# Patient Record
Sex: Female | Born: 1985 | Race: White | Hispanic: No | Marital: Married | Smoking: Never smoker
Health system: Southern US, Community
[De-identification: ages and names within clinical notes are randomized; demographics above are authoritative.]

## PROBLEM LIST (undated history)

## (undated) ENCOUNTER — Inpatient Hospital Stay: Admission: RE | Payer: 59 | Source: Ambulatory Visit

## (undated) DIAGNOSIS — F419 Anxiety disorder, unspecified: Secondary | ICD-10-CM

## (undated) DIAGNOSIS — J45909 Unspecified asthma, uncomplicated: Secondary | ICD-10-CM

## (undated) DIAGNOSIS — F329 Major depressive disorder, single episode, unspecified: Secondary | ICD-10-CM

## (undated) DIAGNOSIS — K219 Gastro-esophageal reflux disease without esophagitis: Secondary | ICD-10-CM

## (undated) DIAGNOSIS — R51 Headache: Secondary | ICD-10-CM

## (undated) DIAGNOSIS — R519 Headache, unspecified: Secondary | ICD-10-CM

## (undated) DIAGNOSIS — F32A Depression, unspecified: Secondary | ICD-10-CM

## (undated) HISTORY — PX: KNEE SURGERY: SHX244

## (undated) HISTORY — DX: Major depressive disorder, single episode, unspecified: F32.9

## (undated) HISTORY — PX: OTHER SURGICAL HISTORY: SHX169

## (undated) HISTORY — DX: Depression, unspecified: F32.A

## (undated) HISTORY — DX: Anxiety disorder, unspecified: F41.9

---

## 2009-10-03 ENCOUNTER — Emergency Department: Payer: Self-pay | Admitting: Internal Medicine

## 2012-07-17 ENCOUNTER — Emergency Department: Payer: Self-pay | Admitting: Emergency Medicine

## 2013-02-16 ENCOUNTER — Inpatient Hospital Stay: Payer: Self-pay | Admitting: Obstetrics and Gynecology

## 2013-02-17 LAB — CBC WITH DIFFERENTIAL/PLATELET
Basophil #: 0 10*3/uL (ref 0.0–0.1)
Basophil %: 0.3 %
Eosinophil #: 0.1 10*3/uL (ref 0.0–0.7)
HCT: 33.7 % — ABNORMAL LOW (ref 35.0–47.0)
HGB: 11.2 g/dL — ABNORMAL LOW (ref 12.0–16.0)
Lymphocyte #: 1.3 10*3/uL (ref 1.0–3.6)
Monocyte %: 6.6 %
Neutrophil %: 79.7 %
Platelet: 251 10*3/uL (ref 150–440)

## 2013-02-19 LAB — CBC WITH DIFFERENTIAL/PLATELET
Eosinophil %: 0 %
HCT: 33.2 % — ABNORMAL LOW (ref 35.0–47.0)
Lymphocyte %: 7.6 %
MCH: 27.3 pg (ref 26.0–34.0)
MCHC: 32.9 g/dL (ref 32.0–36.0)
MCV: 83 fL (ref 80–100)
Monocyte %: 4.2 %
Neutrophil #: 14.4 10*3/uL — ABNORMAL HIGH (ref 1.4–6.5)
Neutrophil %: 88.1 %
RBC: 3.99 10*6/uL (ref 3.80–5.20)
RDW: 14.5 % (ref 11.5–14.5)
WBC: 16.3 10*3/uL — ABNORMAL HIGH (ref 3.6–11.0)

## 2013-02-22 ENCOUNTER — Emergency Department: Payer: Self-pay | Admitting: Emergency Medicine

## 2013-02-22 LAB — URINALYSIS, COMPLETE
Glucose,UR: NEGATIVE mg/dL (ref 0–75)
Ketone: NEGATIVE
Nitrite: NEGATIVE
Ph: 6 (ref 4.5–8.0)
Protein: NEGATIVE
WBC UR: 57 /HPF (ref 0–5)

## 2013-02-22 LAB — COMPREHENSIVE METABOLIC PANEL
Albumin: 2.3 g/dL — ABNORMAL LOW (ref 3.4–5.0)
Alkaline Phosphatase: 115 U/L (ref 50–136)
BUN: 11 mg/dL (ref 7–18)
Bilirubin,Total: 0.2 mg/dL (ref 0.2–1.0)
Calcium, Total: 8.2 mg/dL — ABNORMAL LOW (ref 8.5–10.1)
Chloride: 109 mmol/L — ABNORMAL HIGH (ref 98–107)
Co2: 23 mmol/L (ref 21–32)
Potassium: 3.6 mmol/L (ref 3.5–5.1)
Total Protein: 6.2 g/dL — ABNORMAL LOW (ref 6.4–8.2)

## 2013-02-22 LAB — CBC
MCHC: 32.9 g/dL (ref 32.0–36.0)
RDW: 15.3 % — ABNORMAL HIGH (ref 11.5–14.5)

## 2013-02-28 LAB — CULTURE, BLOOD (SINGLE)

## 2014-10-06 LAB — OB RESULTS CONSOLE RUBELLA ANTIBODY, IGM: RUBELLA: IMMUNE

## 2014-10-06 LAB — OB RESULTS CONSOLE ABO/RH: RH Type: POSITIVE

## 2014-10-06 LAB — OB RESULTS CONSOLE GC/CHLAMYDIA
Chlamydia: NEGATIVE
GC PROBE AMP, GENITAL: NEGATIVE

## 2014-10-06 LAB — OB RESULTS CONSOLE VARICELLA ZOSTER ANTIBODY, IGG: Varicella: IMMUNE

## 2014-10-06 LAB — OB RESULTS CONSOLE ANTIBODY SCREEN: Antibody Screen: NEGATIVE

## 2014-10-06 LAB — OB RESULTS CONSOLE HEPATITIS B SURFACE ANTIGEN: Hepatitis B Surface Ag: NEGATIVE

## 2014-10-06 LAB — OB RESULTS CONSOLE HIV ANTIBODY (ROUTINE TESTING): HIV: NONREACTIVE

## 2014-10-06 LAB — OB RESULTS CONSOLE RPR: RPR: NONREACTIVE

## 2015-02-27 ENCOUNTER — Encounter
Admit: 2015-02-27 | Disposition: A | Discharge status: Home / Self Care | Payer: Self-pay | Attending: Maternal & Fetal Medicine | Admitting: Maternal & Fetal Medicine

## 2015-03-10 NOTE — Op Note (Signed)
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  PROCEDURE: Primary low transverse C-section.  SURGEON:  Adria Devonarrie Sterling Mondo, MD   ESTIMATED BLOOD LOSS:  Approximately 500 mL.   FINDINGS: Small placenta with very skinny cord with baby weighing 6 pounds, 3 ounces with Apgars of 9 and 9.  Normal female anatomy.   DESCRIPTION OF PROCEDURE:  The patient was taken to the operating room and placed in the spine position. After adequate epidural spinal anesthesia was instilled, the patient was prepped and draped in the usual sterile fashion. A Pfannenstiel skin incision was made approximately 2 fingerbreadths above the pubic symphysis and carried sharply down to the fascia. The fascia was nicked in the midline and the incision was extended in a superolateral manner with curved Mayo scissors. The fascia was grasped with Kochers and sharply and bluntly dissected off the rectus muscles. The midline was identified and separated. The peritoneum was grasped and entered. Bladder blade was placed. A bladder flap was created. Uterine incision was made and the infant was delivered.  Thick meconium was identified and the infant was delivered with 1 nuchal cord. The nuchal cord was reduced. The cord was clamped and cut and the infant was handed to the awaiting pediatrician. Cord blood was obtained. Pitocin was started. Placenta was delivered spontaneously. The uterus was exteriorized and wrapped in a moist laparotomy sponge. Interior of the uterus was curetted with a moist lap sponge. Uterine incision was grasped with (Dictation Anomaly) pennington clamps. A running locked chromic suture and then a running imbricating chromic suture was used to close the uterine incisions. 3-0 chromic was then used to tack the bladder  back up to the uterine incision. The belly was cleared of clots and irrigated with normal saline. The uterus was placed back into the abdomen. Clots were removed. Muscle bellies were approximated with a running Vicryl suture. The On-Q trocars were then placed and the catheters were then threaded and laid down on the muscle bellies. The fascia was closed with a running 0 Vicryl suture. Plain gut suture was used to approximate the subcutaneous back.  Skin clips were placed across the incision. Dermabond was used to cover the opening where the trocar had gone through the skin.  The (Dictation Anomaly) trocar site was closed with a 4 x 4 and Tegaderm to the skin.  Telfa and 4 x 4's were placed on the Pfannenstiel skin incisions. Tape was placed. Clear urine was noted in the Foley bag. The uterus was found to be firm.  All clots were expelled.  The patient was then taken to recovery after having tolerated the procedure well. ____________________________ Elliot Gurneyarrie C. Aila Terra, MD cck:sb D: 02/25/2013 08:02:00 ET T: 02/25/2013 08:40:09 ET JOB#: [id number]  cc: Elliot Gurneyarrie C. Inita Uram, MD, <Dictator> Elliot GurneyARRIE C Aleksi Brummet MD ELECTRONICALLY SIGNED 02/26/2013 9:28

## 2015-03-13 ENCOUNTER — Other Ambulatory Visit: Payer: Self-pay | Admitting: Maternal & Fetal Medicine

## 2015-03-19 NOTE — Consult Note (Signed)
Referral Information:  Reason for Referral 29 yo gravida 3 para 1011 at [redacted]w[redacted]d gestation by dates and [redacted]w[redacted]d [place] is referred for evaluation of fetal renal pelviectasis.   Referring Physician [facility]   Prenatal Hx Normal first trimeter screening.  Female fetus  12/04/2014 - [redacted]w[redacted]d - R renal pelvis measured 4 mm; L renal pelvis measured 4.7 mm.   01/19/2015 - [redacted]w[redacted]d - R renal pelvis measured 14.5 mm; L renal pelvis measured 7 mm.   Past Obstetrical Hx 03/01/2013 - Cesarean Section at [redacted] weeks gestation for 6'3oz female infant.  NR FHTs   Home Medications: Medication Instructions Status  albuterol  inhaled , As Needed - for Wheezing Active  Zantac 150 150 mg oral tablet 1 tab(s) orally 2 times a day Active  multivitamin, prenatal 1   once a day Active   Allergies:   Penicillin: Hives  Amoxicillin: Unknown  Prednisone: Unknown  Vital Signs/Notes:  Nursing Vital Signs: **Vital Signs.:   11-Apr-16 08:52  Vital Signs Type Routine  Temperature Temperature (F) 98.1  Celsius 36.7  Pulse Pulse 104  Respirations Respirations 18  Systolic BP Systolic BP 125  Diastolic BP (mmHg) Diastolic BP (mmHg) 56  Mean BP 79  Pulse Ox % Pulse Ox % 98  Pulse Ox Activity Level  At rest  Oxygen Delivery Room Air/ 21 %   Perinatal Consult:  LMP 10-Jul-2014   PMed Hx Exercise induced asthma - has not used inhaler in 3 years   PSurg Hx cesarean delivery   FHx Father - HTN, DM; Mother - HTN, thyroid disease;   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  The left renal pelvis measures 6.1 mm, which is within normal limits (< 7 mm) for this gestational age.  The right renal pelvis measures between 7.3 and 7.7 mm.  There is no flattening of the pyramids.   Impression/Recommendations:  Impression 29 yo gravida 3 para 1011 at [redacted]w[redacted]d gestation by dates and [redacted]w[redacted]d [place] with: 1. fetal renal pelviectasis 2. obesity 3. previous cesarean with 6'3oz fetus   Recommendations 1. fetal renal pelviectasis     The patient and her husband were counseled about fetal pelviectasis.  Pelviectasis can improve, worsen, or remain stable over time. Possible etiologies include normal variant, ureteropelvic junction obstruction, ureterovesical junction obstruction or reflux, and bladder outlet obstruction.      The patient was scheduled to return in 4 weeks to reassess the fetal kidneys and assess fetal growth.  If the pelviectasis persists, which I anticipate, the newborn will need postnatal imaging and follow-up.  The pediatrician should be apprised. 2. obesity - follow-up ultrasound scheduled 3. previous cesarean with 6'3oz fetus     The current plan is for scheduled cesarean delivery 04/14/2015.  If the patient labors spontaneously, she will consider a trial of labor.    Total Time Spent with Patient 30 minutes   >50% of  visit spent in couseling/coordination of care yes   Office Use Only [address]  Level 2 (30min) NEW office consult exp prob focused   Coding Description: FETAL - 2nd/3rd TRIMESTER INDICATION(S).   ANATOMY - Detailed - Also check indication/why detailed.   Hydronephrosis - Pylectasis.  Electronic Signatures: Angela Morton, Angela Morton (MD)  (Signed 11-Apr-16 10:49)  Authored: Referral, Home Medications, Allergies, Vital Signs/Notes, Consult, Exam, Lab/Radiology Notes, Impression, Billing, Coding Description   Last Updated: 11-Apr-16 10:49 by Angela Morton,  Angela Morton (MD)

## 2015-03-21 ENCOUNTER — Other Ambulatory Visit: Payer: Self-pay | Admitting: Maternal & Fetal Medicine

## 2015-03-21 DIAGNOSIS — O358XX1 Maternal care for other (suspected) fetal abnormality and damage, fetus 1: Principal | ICD-10-CM

## 2015-03-21 DIAGNOSIS — IMO0001 Reserved for inherently not codable concepts without codable children: Secondary | ICD-10-CM

## 2015-03-30 ENCOUNTER — Ambulatory Visit
Admission: RE | Admit: 2015-03-30 | Discharge: 2015-03-30 | Disposition: A | Discharge status: Home / Self Care | Payer: 59 | Source: Ambulatory Visit | Attending: Maternal & Fetal Medicine | Admitting: Maternal & Fetal Medicine

## 2015-03-30 DIAGNOSIS — Z3A37 37 weeks gestation of pregnancy: Secondary | ICD-10-CM | POA: Diagnosis not present

## 2015-03-30 DIAGNOSIS — O358XX1 Maternal care for other (suspected) fetal abnormality and damage, fetus 1: Secondary | ICD-10-CM | POA: Diagnosis present

## 2015-03-30 DIAGNOSIS — IMO0001 Reserved for inherently not codable concepts without codable children: Secondary | ICD-10-CM

## 2015-03-30 LAB — US OB FOLLOW UP

## 2015-04-13 ENCOUNTER — Encounter
Admission: RE | Admit: 2015-04-13 | Discharge: 2015-04-13 | Disposition: A | Discharge status: Home / Self Care | Payer: 59 | Source: Ambulatory Visit | Attending: Obstetrics & Gynecology | Admitting: Obstetrics & Gynecology

## 2015-04-13 HISTORY — DX: Headache, unspecified: R51.9

## 2015-04-13 HISTORY — DX: Gastro-esophageal reflux disease without esophagitis: K21.9

## 2015-04-13 HISTORY — DX: Unspecified asthma, uncomplicated: J45.909

## 2015-04-13 HISTORY — DX: Headache: R51

## 2015-04-13 LAB — CBC
HCT: 33 % — ABNORMAL LOW (ref 35.0–47.0)
Hemoglobin: 10.6 g/dL — ABNORMAL LOW (ref 12.0–16.0)
MCH: 26.5 pg (ref 26.0–34.0)
MCHC: 32.2 g/dL (ref 32.0–36.0)
MCV: 82.2 fL (ref 80.0–100.0)
Platelets: 225 10*3/uL (ref 150–440)
RBC: 4.02 MIL/uL (ref 3.80–5.20)
RDW: 14.9 % — ABNORMAL HIGH (ref 11.5–14.5)
WBC: 11.3 10*3/uL — AB (ref 3.6–11.0)

## 2015-04-13 NOTE — Discharge Instructions (Signed)
Angela Morton   Your procedure is scheduled on: Apr 14, 2015   Report to [facility] at 11:30             [facility]             [address]             [place], [place] [address]  Call this number if you have problems the morning of surgery: [phone]   Remember:   Do not eat food or drink liquids after midnight.     Do not wear jewelry, make-up or nail polish.  Do not wear lotions, powders, or perfumes. You may wear deodorant.  Do not shave prior to surgery.  Do not bring valuables to the hospital.  [facility] is not responsible for any belongings              or valuables.                Contacts, dentures or bridgework may not be worn into surgery.  Leave suitcase in the car. After surgery it may be brought to your room.  For patients admitted to the hospital, discharge time is determined by your             treatment team.               Special Instructions: Preparing the skin before Cesarean Section              To help prevent the risk of infection at your surgical site, we are providing             you with rinse-free Sage 2% Chlorhexidine Gluconate (HCG) disposable             wipes.               The night before surgery:              1. Shower or bathe with warm water             2. Do not apply lotion or perfume             3. Wait one hour after shower, skin should be dry and cool             4. Open Sage wipe package - 2 disposable cloths are inside             5. Wipe the lower abdomen from the pubic line to the navel and hip bone to hip             bone with one cloth             6. Use the second cloth to wipe the front of the upper thighs             7. Allow the area to dry for one minute. DO NOT RINSE             8. Skin may feel "tacky" for several minutes             9. Dress in freshly laundered, clean clothes           10. Do not shower the morning of surgery    Please read over the following fact sheets  that you were given: Coughing and  Deep Breathing and Surgical Site Infection Prevention   Cesarean Delivery  Cesarean delivery is the birth of a baby through a cut (incision) in the abdomen and womb (uterus).  LET [facility] CARE PROVIDER KNOW ABOUT:  All medicines you are taking, including vitamins, herbs, eye drops, creams, and over-the-counter medicines.  Previous problems you or members of your family have had with the use of anesthetics.  Any blood disorders you have.  Previous surgeries you have had.  Medical conditions you have.  Any allergies you have.  Complicationsinvolving the pregnancy. RISKS AND COMPLICATIONS  Generally, this is a safe procedure. However, as with any procedure, complications can occur. Possible complications include:  Bleeding.  Infection.  Blood clots.  Injury to surrounding organs.  Problems with anesthesia.  Injury to the baby. BEFORE THE PROCEDURE   You may be given an antacid medicine to drink. This will prevent acid contents in your stomach from going into your lungs if you vomit during the surgery.  You may be given an antibiotic medicine to prevent infection. PROCEDURE   Hair may be removed from your pubic area and your lower abdomen. This is to prevent infection in the incision site.  A tube (Foley catheter) will be placed in your bladder to drain your urine from your bladder into a bag. This keeps your bladder empty during surgery.  An IV tube will be placed in your vein.  You may be given medicine to numb the lower half of your body (regional anesthetic). If you were in labor, you may have already had an epidural in place which can be used in both labor and cesarean delivery. You may possibly be given medicine to make you sleep (general anesthetic) though this is not as common.  An incision will be made in your abdomen that extends to your uterus. There are 2 basic kinds of incisions:  The horizontal (transverse)  incision. Horizontal incisions are from side to side and are used for most routine cesarean deliveries.  The vertical incision. The vertical incision is from the top of the abdomen to the bottom and is less commonly used. It is often done for women who have a serious complication (extreme prematurity) or under emergency situations.  The horizontal and vertical incisions may both be used at the same time. However, this is very uncommon.  An incision is then made in your uterus to deliver the baby.  Your baby will then be delivered.  Both incisions are then closed with absorbable stitches. AFTER THE PROCEDURE   If you were awake during the surgery, you will see your baby right away. If you were asleep, you will see your baby as soon as you are awake.  You may breastfeed your baby after surgery.  You may be able to get up and walk the same day as the surgery. If you need to stay in bed for a period of time, you will receive help to turn, cough, and take deep breaths after surgery. This helps prevent lung problems such as pneumonia.  Do not get out of bed alone the first time after surgery. You will need help getting out of bed until you are able to do this by yourself.  You may be able to shower the day after your cesarean delivery. After the bandage (dressing) is taken off the incision site, a nurse will assist you to shower if you would like help.  You will have pneumatic compression hose placed on your lower legs. This is  done to prevent blood clots. When you are up and walking regularly, they will no longer be necessary.  Do not cross your legs when you sit.  Save any blood clots that you pass. If you pass a clot while on the toilet, do not flush it. Call for the nurse. Tell the nurse if you think you are bleeding too much or passing too many clots.  You will be given medicine as needed. Let your health care providers know if you are hurting. You may also be given an antibiotic to  prevent an infection.  Your IV tube will be taken out when you are drinking a reasonable amount of fluids. The Foley catheter is taken out when you are up and walking.  If your blood type is Rh negative and your baby's blood type is Rh positive, you will be given a shot of anti-D immune globulin. This shot prevents you from having Rh problems with a future pregnancy. You should get the shot even if you had your tubes tied (tubal ligation).  If you are allowed to take the baby for a walk, place the baby in the bassinet and push it. Do not carry your baby in your arms. Document Released: 11/04/2005 Document Revised: 08/25/2013 Document Reviewed: 05/26/2013 [facility] Patient Information 2015 [place], [place]. This information is not intended to replace advice given to you by your health care provider. Make sure you discuss any questions you have with your health care provider.  Incentive Spirometer An incentive spirometer is a tool that can help keep your lungs clear and active. This tool measures how well you are filling your lungs with each breath. Taking long, deep breaths may help reverse or decrease the chance of developing breathing (pulmonary) problems (especially infection) following:  Surgery of the chest or abdomen.  Surgery if you have a history of smoking or a lung problem.  A long period of time when you are unable to move or be active. BEFORE THE PROCEDURE   If the spirometer includes an indicator to show your best effort, your nurse or respiratory therapist will set it to a desired goal.  If possible, sit up straight or lean slightly forward. Try not to slouch.  Hold the incentive spirometer in an upright position. INSTRUCTIONS FOR USE   Sit on the edge of your bed if possible, or sit up as far as you can in bed or on a chair.  Hold the incentive spirometer in an upright position.  Breathe out normally.  Place the mouthpiece in your mouth and seal your lips tightly around  it.  Breathe in slowly and as deeply as possible, raising the piston or the ball toward the top of the column.  Hold your breath for 3-5 seconds or for as long as possible. Allow the piston or ball to fall to the bottom of the column.  Remove the mouthpiece from your mouth and breathe out normally.  Rest for a few seconds and repeat Steps 1 through 7 at least 10 times every 1-2 hours when you are awake. Take your time and take a few normal breaths between deep breaths.  The spirometer may include an indicator to show your best effort. Use the indicator as a goal to work toward during each repetition.  After each set of 10 deep breaths, practice coughing to be sure your lungs are clear. If you have an incision (the cut made at the time of surgery), support your incision when coughing by placing a pillow  or rolled-up towels firmly against it. Once you are able to get out of bed, walk around indoors and cough well. You may stop using the incentive spirometer when instructed by your caregiver.  RISKS AND COMPLICATIONS  Breathing too quickly may cause dizziness. At an extreme, this could cause you to pass out. Take your time so you do not get dizzy or light-headed.  If you are in pain, you may need to take or ask for pain medication before doing incentive spirometry. It is harder to take a deep breath if you are having pain. AFTER USE  Rest and breathe slowly and easily.  It can be helpful to keep a log of your progress. Your caregiver can provide you with a simple table to help with this. If you are using the spirometer at home, follow these instructions: SEEK MEDICAL CARE IF:   You are having difficultly using the spirometer.  You have trouble using the spirometer as often as instructed.  Your pain medication is not giving enough relief while using the spirometer.  You develop fever of 100.87F (38.1C) or higher. SEEK IMMEDIATE MEDICAL CARE IF:   You cough up bloody sputum that had  not been present before.  You develop fever of 102F (38.9C) or greater.  You develop worsening pain at or near the incision site. MAKE SURE YOU:   Understand these instructions.  Will watch your condition.  Will get help right away if you are not doing well or get worse. Document Released: 03/17/2007 Document Revised: 03/21/2014 Document Reviewed: 05/18/2007 [facility] Patient Information 2015 [place], [place]. This information is not intended to replace advice given to you by your health care provider. Make sure you discuss any questions you have with your health care provider.  Surgical Site Infections FAQs What is a Surgical Site Infection (SSI)? A surgical site infection is an infection that occurs after surgery in the part of the body where the surgery took place. Most patients who have surgery do not develop an infection. However, infections develop in about 1 to 3 out of every 100 patients who have surgery. Some of the common symptoms of a surgical site infection are:  Redness and pain around the area where you had surgery  Drainage of cloudy fluid from your surgical wound  Fever Can SSIs be treated? Yes. Most surgical site infections can be treated with antibiotics. The antibiotic given to you depends on the bacteria (germs) causing the infections. Sometimes patients with SSIs also need another surgery to treat the infection. What are some of the things that hospitals are doing to prevent SSIs? To prevent SSIs, doctors, nurses, and other healthcare providers:  Clean their hands and arms up to their elbows with an antiseptic agent just before the surgery.  Clean their hands with soap and water or an alcohol-based hand rub before and after caring for each patient.  May remove some of your hair immediately before your surgery using electric clippers if the hair is in the same area where the procedure will occur. They should not shave you with a razor.  Wear special hair  covers, masks, gowns, and gloves during surgery to keep the surgery area clean.  Give you antibiotics before your surgery starts. In most cases, you should get antibiotics within 60 minutes before the surgery starts and the antibiotics should be stopped within 24 hours after surgery.  Clean the skin at the site of your surgery with a special soap that kills germs. What can I do to  help prevent SSIs? Before your surgery:  Tell your doctor about other medical problems you may have. Health problems such as allergies, diabetes, and obesity could affect your surgery and your treatment.  Quit smoking. Patients who smoke get more infections. Talk to your doctor about how you can quit before your surgery.  Do not shave near where you will have surgery. Shaving with a razor can irritate your skin and make it easier to develop an infection. At the time of your surgery:  Speak up if someone tries to shave you with a razor before surgery. Ask why you need to be shaved and talk with your surgeon if you have any concerns.  Ask if you will get antibiotics before surgery. After your surgery:  Make sure that your healthcare providers clean their hands before examining you, either with soap and water or an alcohol-based hand rub.  If you do not see your providers clean their hands, please ask them to do so.  Family and friends who visit you should not touch the surgical wound or dressings.  Family and friends should clean their hands with soap and water or an alcohol-based hand rub before and after visiting you. If you do not see them clean their hands, ask them to clean their hands. What do I need to do when I go home from the hospital?  Before you go home, your doctor or nurse should explain everything you need to know about taking care of your wound. Make sure you understand how to care for your wound before you leave the hospital.  Always clean your hands before and after caring for your  wound.  Before you go home, make sure you know who to contact if you have questions or problems after you get home.  If you have any symptoms of an infection, such as redness and pain at the surgery site, drainage, or fever, call your doctor immediately. If you have additional questions, please ask your doctor or nurse. Developed and co-sponsored by [employer] for [employer] of [place] [phone]); Infectious Diseases Society of America (IDSA); [facility] Association; Association for Professionals in Infection Control and Epidemiology (APIC); Centers for Disease Control and Prevention (CDC); and [employer] Document Released: 11/09/2013 Document Reviewed: 11/09/2013 [facility] Patient Information 2015 [place], [place]. This information is not intended to replace advice given to you by your health care provider. Make sure you discuss any questions you have with your health care provider.

## 2015-04-14 ENCOUNTER — Encounter
Admission: RE | Disposition: A | Discharge status: Home / Self Care | Payer: Self-pay | Source: Home / Self Care | Attending: Obstetrics & Gynecology

## 2015-04-14 ENCOUNTER — Inpatient Hospital Stay: Payer: 59 | Admitting: Registered Nurse

## 2015-04-14 ENCOUNTER — Encounter: Payer: Self-pay | Admitting: *Deleted

## 2015-04-14 ENCOUNTER — Inpatient Hospital Stay
Admission: RE | Admit: 2015-04-14 | Discharge: 2015-04-17 | DRG: 766 | Disposition: A | Discharge status: Home / Self Care | Payer: 59 | Attending: Obstetrics & Gynecology | Admitting: Obstetrics & Gynecology

## 2015-04-14 DIAGNOSIS — O3421 Maternal care for scar from previous cesarean delivery: Principal | ICD-10-CM | POA: Diagnosis present

## 2015-04-14 DIAGNOSIS — Z3A39 39 weeks gestation of pregnancy: Secondary | ICD-10-CM | POA: Diagnosis present

## 2015-04-14 LAB — HIV ANTIBODY (ROUTINE TESTING W REFLEX): HIV Screen 4th Generation wRfx: NONREACTIVE

## 2015-04-14 LAB — RPR: RPR Ser Ql: NONREACTIVE

## 2015-04-14 SURGERY — Surgical Case
Anesthesia: Spinal | Wound class: Clean Contaminated

## 2015-04-14 MED ORDER — NALBUPHINE HCL 10 MG/ML IJ SOLN: 5.0000 mg | Freq: Once | INTRAMUSCULAR | Status: AC | PRN

## 2015-04-14 MED ORDER — CLINDAMYCIN PHOSPHATE 900 MG/50ML IV SOLN: 900.0000 mg | Freq: Once | INTRAVENOUS | Status: DC

## 2015-04-14 MED ORDER — KETOROLAC TROMETHAMINE 30 MG/ML IJ SOLN: 30.0000 mg | Freq: Four times a day (QID) | INTRAMUSCULAR | Status: AC | PRN

## 2015-04-14 MED ORDER — DIPHENHYDRAMINE HCL 50 MG/ML IJ SOLN
INTRAMUSCULAR | Status: AC
  Administered 2015-04-14: 12.5 mg via INTRAVENOUS
  Filled 2015-04-14: qty 1

## 2015-04-14 MED ORDER — CITRIC ACID-SODIUM CITRATE 334-500 MG/5ML PO SOLN
30.0000 mL | Freq: Once | ORAL | Status: AC
  Administered 2015-04-14: 30 mL via ORAL

## 2015-04-14 MED ORDER — CITRIC ACID-SODIUM CITRATE 334-500 MG/5ML PO SOLN
ORAL | Status: AC
  Administered 2015-04-14: 30 mL via ORAL
  Filled 2015-04-14: qty 15

## 2015-04-14 MED ORDER — OXYTOCIN 40 UNITS IN LACTATED RINGERS INFUSION - SIMPLE MED
62.5000 mL/h | INTRAVENOUS | Status: DC
  Administered 2015-04-14: 40 [IU] via INTRAVENOUS
  Administered 2015-04-14: 790 mL via INTRAVENOUS
  Administered 2015-04-14: 10 mL via INTRAVENOUS

## 2015-04-14 MED ORDER — NALOXONE HCL 0.4 MG/ML IJ SOLN: 0.4000 mg | INTRAMUSCULAR | Status: DC | PRN

## 2015-04-14 MED ORDER — PRENATAL MULTIVITAMIN CH
1.0000 | ORAL_TABLET | Freq: Every day | ORAL | Status: DC
  Administered 2015-04-15 – 2015-04-17 (×3): 1 via ORAL
  Filled 2015-04-14 (×3): qty 1

## 2015-04-14 MED ORDER — BUPIVACAINE 0.25 % ON-Q PUMP SINGLE CATH 400 ML: 400.0000 mL | INJECTION | Status: DC

## 2015-04-14 MED ORDER — SODIUM CHLORIDE 0.9 % IJ SOLN: 3.0000 mL | INTRAMUSCULAR | Status: DC | PRN

## 2015-04-14 MED ORDER — OXYTOCIN 40 UNITS IN LACTATED RINGERS INFUSION - SIMPLE MED
INTRAVENOUS | Status: AC
  Filled 2015-04-14: qty 1000

## 2015-04-14 MED ORDER — ACETAMINOPHEN 325 MG PO TABS: 650.0000 mg | ORAL_TABLET | ORAL | Status: DC | PRN

## 2015-04-14 MED ORDER — KETOROLAC TROMETHAMINE 30 MG/ML IJ SOLN: 30.0000 mg | Freq: Four times a day (QID) | INTRAMUSCULAR | Status: DC | PRN

## 2015-04-14 MED ORDER — WITCH HAZEL-GLYCERIN EX PADS: 1.0000 "application " | MEDICATED_PAD | CUTANEOUS | Status: DC | PRN

## 2015-04-14 MED ORDER — PHENYLEPHRINE HCL 10 MG/ML IJ SOLN
INTRAMUSCULAR | Status: DC | PRN
  Administered 2015-04-14: 50 ug via INTRAVENOUS

## 2015-04-14 MED ORDER — EPHEDRINE SULFATE 50 MG/ML IJ SOLN
INTRAMUSCULAR | Status: DC | PRN
  Administered 2015-04-14: 10 mg via INTRAVENOUS

## 2015-04-14 MED ORDER — TETANUS-DIPHTH-ACELL PERTUSSIS 5-2.5-18.5 LF-MCG/0.5 IM SUSP: 0.5000 mL | Freq: Once | INTRAMUSCULAR | Status: DC

## 2015-04-14 MED ORDER — DIPHENHYDRAMINE HCL 50 MG/ML IJ SOLN: 12.5000 mg | INTRAMUSCULAR | Status: DC | PRN

## 2015-04-14 MED ORDER — BUPIVACAINE IN DEXTROSE 0.75-8.25 % IT SOLN
INTRATHECAL | Status: DC | PRN
  Administered 2015-04-14: 1.7 mL via INTRATHECAL

## 2015-04-14 MED ORDER — OXYTOCIN BOLUS FROM INFUSION: 500.0000 mL | INTRAVENOUS | Status: DC

## 2015-04-14 MED ORDER — BUPIVACAINE HCL (PF) 0.5 % IJ SOLN
INTRAMUSCULAR | Status: AC
  Filled 2015-04-14: qty 30

## 2015-04-14 MED ORDER — OXYCODONE-ACETAMINOPHEN 5-325 MG PO TABS
1.0000 | ORAL_TABLET | ORAL | Status: DC | PRN
  Administered 2015-04-16 – 2015-04-17 (×6): 1 via ORAL
  Filled 2015-04-14 (×6): qty 1

## 2015-04-14 MED ORDER — NALBUPHINE HCL 10 MG/ML IJ SOLN: 5.0000 mg | INTRAMUSCULAR | Status: DC | PRN

## 2015-04-14 MED ORDER — DIPHENHYDRAMINE HCL 25 MG PO CAPS: 25.0000 mg | ORAL_CAPSULE | ORAL | Status: DC | PRN

## 2015-04-14 MED ORDER — MEPERIDINE HCL 25 MG/ML IJ SOLN: 6.2500 mg | INTRAMUSCULAR | Status: DC | PRN

## 2015-04-14 MED ORDER — DIBUCAINE 1 % RE OINT: 1.0000 "application " | TOPICAL_OINTMENT | RECTAL | Status: DC | PRN

## 2015-04-14 MED ORDER — SIMETHICONE 80 MG PO CHEW
80.0000 mg | CHEWABLE_TABLET | Freq: Three times a day (TID) | ORAL | Status: DC
  Administered 2015-04-15 (×2): 80 mg via ORAL
  Filled 2015-04-14: qty 1

## 2015-04-14 MED ORDER — NALOXONE HCL 1 MG/ML IJ SOLN
1.0000 ug/kg/h | INTRAVENOUS | Status: DC | PRN
  Filled 2015-04-14: qty 2

## 2015-04-14 MED ORDER — ONDANSETRON HCL 4 MG/2ML IJ SOLN: 4.0000 mg | Freq: Three times a day (TID) | INTRAMUSCULAR | Status: DC | PRN

## 2015-04-14 MED ORDER — OXYTOCIN 40 UNITS IN LACTATED RINGERS INFUSION - SIMPLE MED
INTRAVENOUS | Status: AC
  Administered 2015-04-14: 40 [IU] via INTRAVENOUS
  Filled 2015-04-14: qty 1000

## 2015-04-14 MED ORDER — ONDANSETRON HCL 4 MG/2ML IJ SOLN
INTRAMUSCULAR | Status: DC | PRN
  Administered 2015-04-14: 4 mg via INTRAVENOUS

## 2015-04-14 MED ORDER — MENTHOL 3 MG MT LOZG
1.0000 | LOZENGE | OROMUCOSAL | Status: DC | PRN
  Filled 2015-04-14: qty 9

## 2015-04-14 MED ORDER — SIMETHICONE 80 MG PO CHEW: 80.0000 mg | CHEWABLE_TABLET | ORAL | Status: DC | PRN

## 2015-04-14 MED ORDER — CLINDAMYCIN PHOSPHATE 900 MG/50ML IV SOLN
INTRAVENOUS | Status: AC
  Filled 2015-04-14: qty 50

## 2015-04-14 MED ORDER — LANOLIN HYDROUS EX OINT: 1.0000 "application " | TOPICAL_OINTMENT | CUTANEOUS | Status: DC | PRN

## 2015-04-14 MED ORDER — SCOPOLAMINE 1 MG/3DAYS TD PT72
1.0000 | MEDICATED_PATCH | Freq: Once | TRANSDERMAL | Status: DC
  Filled 2015-04-14: qty 1

## 2015-04-14 MED ORDER — DIPHENHYDRAMINE HCL 50 MG/ML IJ SOLN
12.5000 mg | Freq: Once | INTRAMUSCULAR | Status: AC
  Administered 2015-04-14: 12.5 mg via INTRAVENOUS

## 2015-04-14 MED ORDER — DEXTROSE 5 % IV SOLN: 2.0000 mg/kg | Freq: Once | INTRAVENOUS | Status: DC

## 2015-04-14 MED ORDER — NALOXONE HCL 1 MG/ML IJ SOLN: 1.0000 ug/kg/h | INTRAVENOUS | Status: DC | PRN

## 2015-04-14 MED ORDER — BUPIVACAINE HCL (PF) 0.5 % IJ SOLN: 10.0000 mL | Freq: Once | INTRAMUSCULAR | Status: DC

## 2015-04-14 MED ORDER — GENTAMICIN SULFATE 40 MG/ML IJ SOLN
5.0000 mg/kg | INTRAMUSCULAR | Status: AC
  Administered 2015-04-14: 570 mg via INTRAVENOUS
  Filled 2015-04-14: qty 14.25

## 2015-04-14 MED ORDER — LACTATED RINGERS IV SOLN
INTRAVENOUS | Status: DC
  Administered 2015-04-15: 11:00:00 via INTRAVENOUS

## 2015-04-14 MED ORDER — OXYCODONE-ACETAMINOPHEN 5-325 MG PO TABS: 2.0000 | ORAL_TABLET | ORAL | Status: DC | PRN

## 2015-04-14 MED ORDER — DIPHENHYDRAMINE HCL 25 MG PO CAPS: 25.0000 mg | ORAL_CAPSULE | Freq: Four times a day (QID) | ORAL | Status: DC | PRN

## 2015-04-14 MED ORDER — SIMETHICONE 80 MG PO CHEW
80.0000 mg | CHEWABLE_TABLET | ORAL | Status: DC
  Administered 2015-04-15 (×2): 80 mg via ORAL
  Filled 2015-04-14 (×3): qty 1

## 2015-04-14 MED ORDER — IBUPROFEN 600 MG PO TABS
600.0000 mg | ORAL_TABLET | Freq: Four times a day (QID) | ORAL | Status: DC
  Administered 2015-04-15 – 2015-04-17 (×12): 600 mg via ORAL
  Filled 2015-04-14 (×11): qty 1

## 2015-04-14 MED ORDER — NALBUPHINE HCL 10 MG/ML IJ SOLN: 5.0000 mg | Freq: Once | INTRAMUSCULAR | Status: DC | PRN

## 2015-04-14 MED ORDER — LACTATED RINGERS IV SOLN
INTRAVENOUS | Status: DC | PRN
Start: 2015-04-14 — End: 2015-04-14
  Administered 2015-04-14: 14:00:00 via INTRAVENOUS

## 2015-04-14 MED ORDER — OXYTOCIN 40 UNITS IN LACTATED RINGERS INFUSION - SIMPLE MED: 62.5000 mL/h | INTRAVENOUS | Status: AC

## 2015-04-14 MED ORDER — CLINDAMYCIN PHOSPHATE 900 MG/50ML IV SOLN
900.0000 mg | INTRAVENOUS | Status: AC
  Administered 2015-04-14: 900 mg via INTRAVENOUS

## 2015-04-14 MED ORDER — BUPIVACAINE 0.25 % ON-Q PUMP DUAL CATH 400 ML: 400.0000 mL | INJECTION | Status: DC

## 2015-04-14 MED ORDER — BUPIVACAINE 0.25 % ON-Q PUMP DUAL CATH 400 ML
INJECTION | Status: AC
  Filled 2015-04-14: qty 400

## 2015-04-14 MED ORDER — BUPIVACAINE HCL (PF) 0.5 % IJ SOLN
INTRAMUSCULAR | Status: DC | PRN
  Administered 2015-04-14: 10 mL

## 2015-04-14 MED ORDER — DOCUSATE SODIUM 100 MG PO CAPS
100.0000 mg | ORAL_CAPSULE | Freq: Two times a day (BID) | ORAL | Status: DC
  Administered 2015-04-15 – 2015-04-17 (×5): 100 mg via ORAL
  Filled 2015-04-14 (×5): qty 1

## 2015-04-14 MED ORDER — MORPHINE SULFATE (PF) 0.5 MG/ML IJ SOLN
INTRAMUSCULAR | Status: DC | PRN
  Administered 2015-04-14: .2 mg via EPIDURAL

## 2015-04-14 SURGICAL SUPPLY — 37 items
CANISTER SUCT 3000ML (MISCELLANEOUS) ×3 IMPLANT
CATH KIT ON-Q SILVERSOAK 5IN (CATHETERS) ×6 IMPLANT
CELL SAVER ADDITIONAL TIME PER (MISCELLANEOUS) ×135
CELL SAVER AUTOCOAGULATE (MISCELLANEOUS) ×3
CELL SAVER STANDBY W/COLL (MISCELLANEOUS) ×3
CLOSURE WOUND 1/2 X4 (GAUZE/BANDAGES/DRESSINGS) ×1
DRSG TELFA 3X8 NADH (GAUZE/BANDAGES/DRESSINGS) ×3 IMPLANT
ELECT CAUTERY BLADE 6.4 (BLADE) ×3 IMPLANT
GAUZE SPONGE 4X4 12PLY STRL (GAUZE/BANDAGES/DRESSINGS) ×3 IMPLANT
GLOVE BIOGEL PI IND STRL 6.5 (GLOVE) ×1 IMPLANT
GLOVE BIOGEL PI INDICATOR 6.5 (GLOVE) ×2
GLOVE SURG SYN 6.5 ES PF (GLOVE) ×3 IMPLANT
GOWN STRL REUS W/ TWL LRG LVL3 (GOWN DISPOSABLE) ×3 IMPLANT
GOWN STRL REUS W/TWL LRG LVL3 (GOWN DISPOSABLE) ×6
LIQUID BAND (GAUZE/BANDAGES/DRESSINGS) ×3 IMPLANT
NS IRRIG 1000ML POUR BTL (IV SOLUTION) ×3 IMPLANT
PACK C SECTION AR (MISCELLANEOUS) ×3 IMPLANT
PAD GROUND ADULT SPLIT (MISCELLANEOUS) ×3 IMPLANT
PAD OB MATERNITY 4.3X12.25 (PERSONAL CARE ITEMS) ×3 IMPLANT
PAD PREP 24X41 OB/GYN DISP (PERSONAL CARE ITEMS) ×3 IMPLANT
SAVE CELL AUTOCOAG (MISCELLANEOUS) ×1 IMPLANT
STANDBY W/COLL CELL SAVER (MISCELLANEOUS) ×1 IMPLANT
STRAP SAFETY BODY (MISCELLANEOUS) ×3 IMPLANT
STRIP CLOSURE SKIN 1/2X4 (GAUZE/BANDAGES/DRESSINGS) ×2 IMPLANT
SUT MNCRL 4-0 (SUTURE) ×2
SUT MNCRL 4-0 27XMFL (SUTURE) ×1
SUT PDS AB 1 TP1 96 (SUTURE) ×3 IMPLANT
SUT VIC AB 0 CT1 36 (SUTURE) ×6 IMPLANT
SUT VIC AB 2-0 CT1 27 (SUTURE) ×2
SUT VIC AB 2-0 CT1 TAPERPNT 27 (SUTURE) ×1 IMPLANT
SUT VIC AB 3-0 SH 27 (SUTURE) ×2
SUT VIC AB 3-0 SH 27X BRD (SUTURE) ×1 IMPLANT
SUTURE MNCRL 4-0 27XMF (SUTURE) ×1 IMPLANT
SWABSTK COMLB BENZOIN TINCTURE (MISCELLANEOUS) ×3 IMPLANT
TIME ADDITIONAL PER CELL SAVER (MISCELLANEOUS) ×45 IMPLANT
TUBING CONNECTING 10 (TUBING) ×2 IMPLANT
TUBING CONNECTING 10' (TUBING) ×1

## 2015-04-14 NOTE — Anesthesia Procedure Notes (Signed)
Spinal Patient location during procedure: OR Staffing Anesthesiologist: Berdine AddisonHOMAS, Atreyu Mak Performed by: anesthesiologist  Preanesthetic Checklist Completed: patient identified, site marked, surgical consent, pre-op evaluation, timeout performed, IV checked and risks and benefits discussed Spinal Block Patient position: sitting Prep: Betadine Patient monitoring: heart rate, cardiac monitor, continuous pulse ox and blood pressure Approach: midline Location: L3-4 Injection technique: single-shot Needle Needle type: Pencil-Tip  Needle gauge: 25 G Needle length: 9 cm Assessment Sensory level: T8 Additional Notes Marcaine and astromorph used.

## 2015-04-14 NOTE — Anesthesia Preprocedure Evaluation (Addendum)
Anesthesia Evaluation  Patient identified by MRN, date of birth, ID band Patient awake    Reviewed: Allergy & Precautions, NPO status , Patient's Chart, lab work & pertinent test results  Airway Mallampati: III  TM Distance: >3 FB     Dental  (+) Chipped   Pulmonary asthma ,          Cardiovascular     Neuro/Psych  Headaches,    GI/Hepatic GERD-  ,  Endo/Other    Renal/GU      Musculoskeletal   Abdominal   Peds  Hematology   Anesthesia Other Findings Obesity.  Reproductive/Obstetrics                            Anesthesia Physical Anesthesia Plan  ASA: III  Anesthesia Plan: Spinal   Post-op Pain Management:    Induction:   Airway Management Planned: Nasal Cannula  Additional Equipment:   Intra-op Plan:   Post-operative Plan:   Informed Consent: I have reviewed the patients History and Physical, chart, labs and discussed the procedure including the risks, benefits and alternatives for the proposed anesthesia with the patient or authorized representative who has indicated his/her understanding and acceptance.     Plan Discussed with: CRNA  Anesthesia Plan Comments:         Anesthesia Quick Evaluation

## 2015-04-14 NOTE — Discharge Summary (Addendum)
Obstetrical Discharge Summary  Date of Admission: 04/14/2015 Date of Discharge: 04/17/2015  Primary OB: [facility]   Gestational Age at Delivery: 7639.5   Antepartum complications: BMI 30s, asthma (no meds), Jehovah's Witness Reason for Admission: Term Pregnancy, Repeat Cesarean Date of Delivery: 04/14/15 Delivered By: Ranae Plumberhelsea Ward, MD Delivery Type: repeat cesarean section, low transverse incision Intrapartum complications/course: None Anesthesia: spinal Placenta: spontaneous Baby: Liveborn female, APGARs9/9, weight 7-10.   Post partum course: uncomplicated Disposition: Home  Rh Immune globulin given: no Rubella vaccine given: no Tdap vaccine given in AP or PP setting: yes Flu vaccine given in AP or PP setting: yes  Contraception: IUD, no bridge  Prenatal Labs: O pos/RI/VI/rpr neg/hiv neg/hepB neg/tdap UTD/pap neg 2014/breast/IUD (no bridge)/[facility] follow up   Plan:  Angela LindauMegan Morton was discharged to home in good condition. Patient has h/o UTI after discharge last time so U/A sent and came back dirty so will hold off on tx since asymptomatic and follow up UCx pending. Results for Angela Morton, Angela Morton (MRN [id number]) as of 04/17/2015 14:31  Ref. Range 04/17/2015 09:43  Appearance Latest Ref Range: CLEAR  HAZY (A)  Bacteria, UA Latest Ref Range: NONE SEEN  RARE (A)  Bilirubin Urine Latest Ref Range: NEGATIVE  NEGATIVE  Color, Urine Latest Ref Range: YELLOW  STRAW (A)  Glucose Latest Ref Range: NEGATIVE mg/dL NEGATIVE  Hgb urine dipstick Latest Ref Range: NEGATIVE  3+ (A)  Ketones, ur Latest Ref Range: NEGATIVE mg/dL NEGATIVE  Leukocytes, UA Latest Ref Range: NEGATIVE  1+ (A)  Nitrite Latest Ref Range: NEGATIVE  NEGATIVE  pH Latest Ref Range: 5.0-8.0  7.0  Protein Latest Ref Range: NEGATIVE mg/dL 30 (A)  RBC / HPF Latest Ref Range: 0-5 RBC/hpf 6-30  Specific Gravity, Urine Latest Ref Range: 1.005-1.030  1.003 (L)  Squamous Epithelial / LPF Latest Ref Range: NONE SEEN  6-30 (A)  WBC,  UA Latest Ref Range: 0-5 WBC/hpf TOO NUMEROUS TO C...   [facility] Dr. Elesa MassedWard in 1 week for incision check   Discharge Medications:   Medication List    TAKE these medications        albuterol 108 (90 BASE) MCG/ACT inhaler  Commonly known as:  PROVENTIL HFA;VENTOLIN HFA  Inhale 2 puffs into the lungs every 6 (six) hours as needed for wheezing or shortness of breath.     BUPIVACAINE 0.25 % ON-Q PUMP DUAL CATH 400 ML  400 mLs by Other route continuous.     docusate sodium 100 MG capsule  Commonly known as:  COLACE  Take 1 capsule (100 mg total) by mouth 2 (two) times daily.     ferrous gluconate 324 MG tablet  Commonly known as:  FERGON  Take 1 tablet (324 mg total) by mouth 2 (two) times daily with a meal.     ibuprofen 600 MG tablet  Commonly known as:  ADVIL,MOTRIN  Take 1 tablet (600 mg total) by mouth every 6 (six) hours as needed.     oxyCODONE-acetaminophen 5-325 MG per tablet  Commonly known as:  PERCOCET/ROXICET  Take 1 tablet by mouth every 6 (six) hours as needed for severe pain.     prenatal multivitamin Tabs tablet  Take 1 tablet by mouth daily.     psyllium 28 % packet  Commonly known as:  METAMUCIL SMOOTH TEXTURE  Take 1 packet by mouth 2 (two) times daily.     ranitidine 150 MG capsule  Commonly known as:  ZANTAC  Take 150 mg  by mouth 2 (two) times daily.        Cornelia Copa MD [facility]  Pager: [phone]

## 2015-04-14 NOTE — H&P (Signed)
Jenean LindauMegan Kreiger is a 29 y.o. female [phone] @ 4239.4 presenting for repeat cesarean at term.  Patient has history of asthma, obesity, history of cesarean, and is a Jehovah's Witness and does not accept blood or products.  She has an unfavorable cervix for induction upon admission and wishes to continue as scheduled.  History  Past Medical History  Diagnosis Date  . Asthma   . GERD (gastroesophageal reflux disease)   . Headache    Past Surgical History  Procedure Laterality Date  . Knees      bil arthroscopy   Family History: family history is not on file. Social History:  reports that she has never smoked. She has never used smokeless tobacco. She reports that she does not drink alcohol or use illicit drugs.   Prenatal Transfer Tool  Maternal Diabetes: No Genetic Screening: Normal Maternal Ultrasounds/Referrals: Normal Fetal Ultrasounds or other Referrals:  None Maternal Substance Abuse:  No Significant Maternal Medications:  None Significant Maternal Lab Results:  None Other Comments:  none  ROS    Blood pressure 127/73, pulse 85, temperature 98.2 F (36.8 C), temperature source Tympanic, resp. rate 18, height 5\' 6"  (1.676 m), weight 113.399 kg (250 lb), last menstrual period 07/10/2014. Exam Physical Exam  Prenatal labs: ABO, Rh: O/Positive/-- (11/19 0000) Antibody: Negative (11/19 0000) Rubella: Immune (11/19 0000) RPR: Non Reactive (05/26 1159)  HBsAg: Negative (11/19 0000)  HIV: Non-reactive (11/19 0000)  GBS:   negative Varicella immune Tdap and flu given during pregnancy  Assessment/Plan: 28yo [id number] @ 39.4 for planned repeat cesarean  1. Admit for surgery 2. Cell saver in room 3. Anticipate routine post partum course  Carinna Newhart C 04/14/2015, 1:15 PM

## 2015-04-14 NOTE — Transfer of Care (Signed)
Immediate Anesthesia Transfer of Care Note  Patient: Jenean LindauMegan Davilla  Procedure(s) Performed: Procedure(s): CESAREAN SECTION (N/A)  Patient Location: PACU  Anesthesia Type:Spinal  Level of Consciousness: awake, alert  and oriented  Airway & Oxygen Therapy: Patient Spontanous Breathing and Patient connected to nasal cannula oxygen  Post-op Assessment: Report given to RN and Post -op Vital signs reviewed and stable  Post vital signs: Reviewed and stable  Last Vitals:  Filed Vitals:   04/14/15 1554  BP: 123/57  Pulse: 62  Temp: 97.4  Resp: 20    Complications: No apparent anesthesia complications

## 2015-04-14 NOTE — Op Note (Addendum)
Cesarean Section Procedure Note  Indications: Term Intrauterine pregnancy with desired repeat cesarean  Pre-operative Diagnosis: 39 week 4 day pregnancy, history of cesarean, pregnancy at term.  Post-operative Diagnosis: same  Attending: Irving Bloor C  Anesthesia: spinal  Surgeon: Nissa Stannard C   Estimated Blood Loss: 750cc         Drains: foley to gravity         Total IV Fluids:  800ml  Findings: 1. Minimal filmy scar tissue on uterus.  2. Normal appearing tube and ovaries  3. Healthy vigorous baby boy with apgars of 9 and 9 at 1 and 5 minutes.  4. Loose nuchal cord, reduced upon delivery of the head.         Specimens: cord blood               Complications:  None apparent  Condition: hemodynamically stable to recovery            Procedure Details   The risks, benefits, complications, treatment options, and expected outcomes were discussed with the patient. Informed consent was obtained. The patient was taken to Operating Room, identified as Angela Morton and the procedure verified as a cesarean delivery.   After administration of anesthesia, the patient was draped and prepped in the usual sterile manner. A surgical time out was performed, with the pediatric team present. A Pfannenstiel incision was made and carried down through the subcutaneous tissue to the fascia. Fascial incision was made and extended transversely. The fascia was separated from the underlying rectus tissue superiorly and inferiorly. The peritoneum was identified and entered. Peritoneal incision was extended longitudinally.  A low transverse uterine incision was made. Delivered from cephalic presentation was a 597-10 female with Apgar scores of 9 at one minute and 9 at five minutes. The umbilical cord was doubly clamped and cut, and the baby was handed off to the awaitng pediatrician.  Cord blood was obtained for evaluation. The placenta was removed intact and appeared normal. The uterus, tubes and ovaries  appeared normal. The uterine incision was closed with running locked sutures of Vicryl, and then a second, imbricating stitch was placed. Hemostasis was observed. The abdominal cavity was evacuated of extraneous fluid. The uterus was returned to the abdominal cavity and again the incision was inspected for hemostasis, which was confirmed.  The paracolic gutters were cleaned.   The peritoneum was reapproximated with vicryl, and the on-Q catheters were placed through the skin and fascia. The fascia was then reapproximated with running sutures of Vicryl. The subcutaneous tissue was irrigated, reapproximated with vicryl, and the skin was reapproximated with Monocryl.  Surgical glue was placed over the incision.  Instrument, sponge, and needle counts were correct prior the abdominal closure and at the conclusion of the case.   I was present and performed this procedure in its entirety.  Cell-saver device was used for this procedure in the event of need for blood salvage.  No blood replacement was necessary.   Angela Plumberhelsea Irish Breisch, MD [facility]

## 2015-04-15 LAB — CBC
HCT: 25.7 % — ABNORMAL LOW (ref 35.0–47.0)
HEMOGLOBIN: 8.6 g/dL — AB (ref 12.0–16.0)
MCH: 27.4 pg (ref 26.0–34.0)
MCHC: 33.6 g/dL (ref 32.0–36.0)
MCV: 81.6 fL (ref 80.0–100.0)
Platelets: 182 10*3/uL (ref 150–440)
RBC: 3.16 MIL/uL — AB (ref 3.80–5.20)
RDW: 14.8 % — ABNORMAL HIGH (ref 11.5–14.5)
WBC: 11.7 10*3/uL — ABNORMAL HIGH (ref 3.6–11.0)

## 2015-04-15 MED ORDER — FAMOTIDINE 20 MG PO TABS
20.0000 mg | ORAL_TABLET | Freq: Every day | ORAL | Status: DC
  Administered 2015-04-15 – 2015-04-17 (×3): 20 mg via ORAL
  Filled 2015-04-15 (×3): qty 1

## 2015-04-15 NOTE — Anesthesia Post-op Follow-up Note (Signed)
  Anesthesia Pain Follow-up Note  Patient: Angela Morton  Day #: 1  Date of Follow-up: 04/15/2015 Time: 11:51 AM  Last Vitals:  Filed Vitals:   04/15/15 0850  BP: 122/67  Pulse: 80  Temp: 36.7 C  Resp: 16    Level of Consciousness: alert  Pain: 1 /10   Side Effects:None  Plan: D/C from anesthesia care  Erik Nessel K

## 2015-04-15 NOTE — Anesthesia Postprocedure Evaluation (Signed)
  Anesthesia Post-op Note  Patient: Jenean LindauMegan Paye  Procedure(s) Performed: Procedure(s): CESAREAN SECTION (N/A)  Anesthesia type:Spinal  Patient location: 349  Post pain: Pain level controlled  Post assessment: Post-op Vital signs reviewed, Patient's Cardiovascular Status Stable, Respiratory Function Stable, Patent Airway and No signs of Nausea or vomiting  Post vital signs: Reviewed and stable  Last Vitals:  Filed Vitals:   04/15/15 0850  BP: 122/67  Pulse: 80  Temp: 36.7 C  Resp: 16    Level of consciousness: awake, alert  and patient cooperative  Complications: No apparent anesthesia complications

## 2015-04-15 NOTE — Progress Notes (Signed)
Subjective: Postpartum Day 1: Cesarean Delivery Patient reports incisional pain, tolerating PO and + flatus.    Objective: Vital signs in last 24 hours: Temp:  [98 F (36.7 C)-98.8 F (37.1 C)] 98.1 F (36.7 C) (05/28 1320) Pulse Rate:  [58-124] 81 (05/28 1320) Resp:  [16-18] 16 (05/28 1320) BP: (116-130)/(57-80) 121/57 mmHg (05/28 1320) SpO2:  [98 %-100 %] 98 % (05/28 1320)  Physical Exam:  General: alert, cooperative and appears stated age Lochia: appropriate Uterine Fundus: firm Incision: healing well DVT Evaluation: No evidence of DVT seen on physical exam.   Recent Labs  04/13/15 1159 04/15/15 0504  HGB 10.6* 8.6*  HCT 33.0* 25.7*    Assessment/Plan: Status post Cesarean section. Doing well postoperatively.  Continue current care.  Angela Morton PAUL 04/15/2015, 1:49 PM

## 2015-04-16 NOTE — Progress Notes (Signed)
Subjective: Postpartum Day 2: Cesarean Delivery Patient reports min incisional pain, tolerating PO and + flatus.    Objective: Vital signs in last 24 hours: Temp:  [98.1 F (36.7 C)-98.4 F (36.9 C)] 98.4 F (36.9 C) (05/29 1124) Pulse Rate:  [81-94] 88 (05/29 1124) Resp:  [16-18] 18 (05/29 1124) BP: (121-138)/(57-82) 127/82 mmHg (05/29 1124) SpO2:  [98 %-99 %] 99 % (05/29 1124)  Physical Exam:  General: alert, cooperative and appears stated age Lochia: appropriate Uterine Fundus: firm Incision: healing well DVT Evaluation: No evidence of DVT seen on physical exam.   Recent Labs  04/15/15 0504  HGB 8.6*  HCT 25.7*    Assessment/Plan: Status post Cesarean section. Doing well postoperatively.  Continue current care. Plans PP IUD  Angela Morton 04/16/2015, 12:12 PM

## 2015-04-17 LAB — URINALYSIS COMPLETE WITH MICROSCOPIC (ARMC ONLY)
Bilirubin Urine: NEGATIVE
Glucose, UA: NEGATIVE mg/dL
Ketones, ur: NEGATIVE mg/dL
Nitrite: NEGATIVE
Protein, ur: 30 mg/dL — AB
Specific Gravity, Urine: 1.003 — ABNORMAL LOW (ref 1.005–1.030)
pH: 7 (ref 5.0–8.0)

## 2015-04-17 MED ORDER — FERROUS GLUCONATE 324 (38 FE) MG PO TABS
324.0000 mg | ORAL_TABLET | Freq: Two times a day (BID) | ORAL | Status: DC
  Administered 2015-04-17 (×2): 324 mg via ORAL
  Filled 2015-04-17 (×3): qty 1

## 2015-04-17 MED ORDER — FERROUS GLUCONATE 324 (38 FE) MG PO TABS: 324.0000 mg | ORAL_TABLET | Freq: Two times a day (BID) | ORAL | Status: DC

## 2015-04-17 MED ORDER — OXYCODONE-ACETAMINOPHEN 5-325 MG PO TABS: 1.0000 | ORAL_TABLET | Freq: Four times a day (QID) | ORAL | Status: DC | PRN

## 2015-04-17 MED ORDER — PSYLLIUM 28 % PO PACK
1.0000 | PACK | Freq: Two times a day (BID) | ORAL | Status: DC
Start: 2015-04-17 — End: 2016-12-18

## 2015-04-17 MED ORDER — DOCUSATE SODIUM 100 MG PO CAPS: 100.0000 mg | ORAL_CAPSULE | Freq: Two times a day (BID) | ORAL | Status: DC

## 2015-04-17 MED ORDER — IBUPROFEN 600 MG PO TABS: 600.0000 mg | ORAL_TABLET | Freq: Four times a day (QID) | ORAL | Status: DC | PRN

## 2015-04-17 MED ORDER — BUPIVACAINE 0.25 % ON-Q PUMP DUAL CATH 400 ML: 400.0000 mL | INJECTION | Status: DC

## 2015-04-17 NOTE — Progress Notes (Signed)
Daily Post Partum Note  Angela Morton is a 29 y.o. [id number] POD#3 s/p  Rpt LTCS. Pregnancy c/b BMI 30s, Jeh Witness, asthma (no meds)  24hr/overnight events:  none  Subjective:  Continues to meet all post op goals including flatus and no chest pain, SOB, s/s of anemia.  Objective:    Current Vital Signs 24h Vital Sign Ranges  T 98.3 F (36.8 C) Temp  Avg: 98.5 F (36.9 C)  Min: 98.3 F (36.8 C)  Max: 98.7 F (37.1 C)  BP 118/69 mmHg BP  Min: 118/69  Max: 127/70  HR 78 Pulse  Avg: 84.7  Min: 78  Max: 88  RR 20 Resp  Avg: 18.7  Min: 18  Max: 20  SaO2 99 % Not Delivered SpO2  Avg: 99 %  Min: 99 %  Max: 99 %       24 Hour I/O Current Shift I/O  Time Ins Outs 05/29 0701 - 05/30 0700 In: 240 [P.O.:240] Out: -       General: NAD Abdomen: obese, +BS, soft, NTTP, ND. On q in place and incision c/d/i with dermabond over it Perineum: deferred Skin:  Warm and dry.  Cardiovascular:Regular rate and rhythm. Respiratory:  Clear to auscultation bilateral. Normal respiratory effort Extremities: no c/c/e  Medications Current Facility-Administered Medications  Medication Dose Route Frequency Provider Last Rate Last Dose  . acetaminophen (TYLENOL) tablet 650 mg  650 mg Oral Q4H PRN Chelsea C Ward, MD      . bupivacaine 0.25 % ON-Q pump DUAL CATH 400 mL  400 mL Other Continuous Chelsea C Ward, MD   400 mL at 04/14/15 1600  . witch hazel-glycerin (TUCKS) pad 1 application  1 application Topical PRN Chelsea C Ward, MD       And  . dibucaine (NUPERCAINAL) 1 % rectal ointment 1 application  1 application Rectal PRN Chelsea C Ward, MD      . diphenhydrAMINE (BENADRYL) injection 12.5 mg  12.5 mg Intravenous Q4H PRN Berdine AddisonMathai Thomas, MD       Or  . diphenhydrAMINE (BENADRYL) capsule 25 mg  25 mg Oral Q4H PRN Berdine AddisonMathai Thomas, MD      . diphenhydrAMINE (BENADRYL) capsule 25 mg  25 mg Oral Q6H PRN Chelsea C Ward, MD      . docusate sodium (COLACE) capsule 100 mg  100 mg Oral BID Elenora Fenderhelsea C Ward, MD   100  mg at 04/16/15 2339  . famotidine (PEPCID) tablet 20 mg  20 mg Oral Daily Nadara Mustardobert P Harris, MD   20 mg at 04/16/15 1118  . ferrous gluconate (FERGON) tablet 324 mg  324 mg Oral BID WC [place] Bingharlie Niccole Witthuhn, MD   324 mg at 04/17/15 0803  . ibuprofen (ADVIL,MOTRIN) tablet 600 mg  600 mg Oral 4 times per day Leola Brazilhelsea C Ward, MD   600 mg at 04/17/15 0516  . lanolin ointment 1 application  1 application Topical PRN Chelsea C Ward, MD      . menthol-cetylpyridinium (CEPACOL) lozenge 3 mg  1 lozenge Oral Q2H PRN Chelsea C Ward, MD      . nalbuphine (NUBAIN) injection 5 mg  5 mg Intravenous Q4H PRN Berdine AddisonMathai Thomas, MD       Or  . nalbuphine (NUBAIN) injection 5 mg  5 mg Subcutaneous Q4H PRN Berdine AddisonMathai Thomas, MD      . naloxone [facility](NARCAN) 2 mg in dextrose 5 % 250 mL infusion  1-4 mcg/kg/hr Intravenous Continuous PRN Berdine AddisonMathai Thomas, MD      .  naloxone (NARCAN) injection 0.4 mg  0.4 mg Intravenous PRN Berdine Addison, MD       And  . sodium chloride 0.9 % injection 3 mL  3 mL Intravenous PRN Berdine Addison, MD      . ondansetron (ZOFRAN) injection 4 mg  4 mg Intravenous Q8H PRN Berdine Addison, MD      . oxyCODONE-acetaminophen (PERCOCET/ROXICET) 5-325 MG per tablet 1 tablet  1 tablet Oral Q4H PRN Elenora Fender Ward, MD   1 tablet at 04/17/15 0515  . oxyCODONE-acetaminophen (PERCOCET/ROXICET) 5-325 MG per tablet 2 tablet  2 tablet Oral Q4H PRN Chelsea C Ward, MD      . prenatal multivitamin tablet 1 tablet  1 tablet Oral Q1200 Chelsea C Ward, MD   1 tablet at 04/16/15 1118  . scopolamine (TRANSDERM-SCOP) 1 MG/3DAYS 1.5 mg  1 patch Transdermal Once Berdine Addison, MD   1.5 mg at 04/14/15 2030  . simethicone (MYLICON) chewable tablet 80 mg  80 mg Oral TID PC Chelsea C Ward, MD   80 mg at 04/15/15 1845  . Tdap (BOOSTRIX) injection 0.5 mL  0.5 mL Intramuscular Once Elenora Fender Ward, MD   0.5 mL at 04/16/15 1736     Recent Labs Lab 04/13/15 1159 04/15/15 0504  WBC 11.3* 11.7*  HGB 10.6* 8.6*  HCT 33.0* 25.7*  PLT 225 182     Assessment & Plan:  Pt doing well *Postpartum/postop: routine care. On q pump instructions given *Anemia: no s/s. Bid iron ordered *Dispo: home today  O pos/RI/VI/rpr neg/hiv neg/hepB neg/tdap UTD/pap neg 2014/breast/IUD (no bridge)/[facility] follow up Cornelia Copa. MD [facility] Pager [phone]

## 2015-04-17 NOTE — Discharge Instructions (Signed)
Remove the On-Q pain pump in your belly in two days, as we discussed Call the office for an incision check for early next week  Cesarean Delivery, Care After Refer to this sheet in the next few weeks. These instructions provide you with information on caring for yourself after your procedure. Your health care provider may also give you specific instructions. Your treatment has been planned according to current medical practices, but problems sometimes occur. Call your health care provider if you have any problems or questions after you go home. HOME CARE INSTRUCTIONS  Only take over-the-counter or prescription medications as directed by your health care provider.  Do not drink alcohol, especially if you are breastfeeding or taking medication to relieve pain.  Do not chew or smoke tobacco.  Continue to use good perineal care. Good perineal care includes:  Wiping your perineum from front to back.  Keeping your perineum clean.  Check your surgical cut (incision) daily for increased redness, drainage, swelling, or separation of skin.  Clean your incision gently with soap and water every day, and then pat it dry. If your health care provider says it is okay, leave the incision uncovered. Use a bandage (dressing) if the incision is draining fluid or appears irritated. If the adhesive strips across the incision do not fall off within 7 days, carefully peel them off.  Hug a pillow when coughing or sneezing until your incision is healed. This helps to relieve pain.  Do not use tampons or douche until your health care provider says it is okay.  Shower, wash your hair, and take tub baths as directed by your health care provider.  Wear a well-fitting bra that provides breast support.  Limit wearing support panties or control-top hose.  Drink enough fluids to keep your urine clear or pale yellow.  Eat high-fiber foods such as whole grain cereals and breads, brown rice, beans, and fresh fruits  and vegetables every day. These foods may help prevent or relieve constipation.  Resume activities such as climbing stairs, driving, lifting, exercising, or traveling as directed by your health care provider.  Talk to your health care provider about resuming sexual activities. This is dependent upon your risk of infection, your rate of healing, and your comfort and desire to resume sexual activity.  Try to have someone help you with your household activities and your newborn for at least a few days after you leave the hospital.  Rest as much as possible. Try to rest or take a nap when your newborn is sleeping.  Increase your activities gradually.  Keep all of your scheduled postpartum appointments. It is very important to keep your scheduled follow-up appointments. At these appointments, your health care provider will be checking to make sure that you are healing physically and emotionally. SEEK MEDICAL CARE IF:   You are passing large clots from your vagina. Save any clots to show your health care provider.  You have a foul smelling discharge from your vagina.  You have trouble urinating.  You are urinating frequently.  You have pain when you urinate.  You have a change in your bowel movements.  You have increasing redness, pain, or swelling near your incision.  You have pus draining from your incision.  Your incision is separating.  You have painful, hard, or reddened breasts.  You have a severe headache.  You have blurred vision or see spots.  You feel sad or depressed.  You have thoughts of hurting yourself or your newborn.  You have questions about your care, the care of your newborn, or medications.  You are dizzy or light-headed.  You have a rash.  You have pain, redness, or swelling at the site of the removed intravenous access (IV) tube.  You have nausea or vomiting.  You stopped breastfeeding and have not had a menstrual period within 12 weeks of  stopping.  You are not breastfeeding and have not had a menstrual period within 12 weeks of delivery.  You have a fever. SEEK IMMEDIATE MEDICAL CARE IF:  You have persistent pain.  You have chest pain.  You have shortness of breath.  You faint.  You have leg pain.  You have stomach pain.  Your vaginal bleeding saturates 2 or more sanitary pads in 1 hour. MAKE SURE YOU:   Understand these instructions.  Will watch your condition.  Will get help right away if you are not doing well or get worse. Document Released: 07/27/2002 Document Revised: 03/21/2014 Document Reviewed: 07/01/2012 [facility] Patient Information 2015 [employer] [place]. This information is not intended to replace advice given to you by your health care provider. Make sure you discuss any questions you have with your health care provider.

## 2015-04-17 NOTE — Progress Notes (Signed)
Pt discharged home with infant. Discharge teaching completed.  

## 2015-04-19 LAB — URINE CULTURE

## 2015-04-28 ENCOUNTER — Telehealth: Payer: Self-pay | Admitting: Obstetrics and Gynecology

## 2015-06-15 NOTE — Telephone Encounter (Signed)
error 

## 2016-12-18 ENCOUNTER — Inpatient Hospital Stay: Payer: 59 | Admitting: Anesthesiology

## 2016-12-18 ENCOUNTER — Inpatient Hospital Stay
Admission: EM | Admit: 2016-12-18 | Discharge: 2016-12-21 | DRG: 766 | Disposition: A | Discharge status: Home / Self Care | Payer: 59 | Attending: Obstetrics & Gynecology | Admitting: Obstetrics & Gynecology

## 2016-12-18 ENCOUNTER — Encounter
Admission: EM | Disposition: A | Discharge status: Home / Self Care | Payer: Self-pay | Source: Home / Self Care | Attending: Obstetrics & Gynecology

## 2016-12-18 DIAGNOSIS — O99214 Obesity complicating childbirth: Secondary | ICD-10-CM | POA: Diagnosis present

## 2016-12-18 DIAGNOSIS — O9952 Diseases of the respiratory system complicating childbirth: Secondary | ICD-10-CM | POA: Diagnosis present

## 2016-12-18 DIAGNOSIS — Z3A38 38 weeks gestation of pregnancy: Secondary | ICD-10-CM | POA: Diagnosis not present

## 2016-12-18 DIAGNOSIS — D649 Anemia, unspecified: Secondary | ICD-10-CM | POA: Diagnosis present

## 2016-12-18 DIAGNOSIS — O1414 Severe pre-eclampsia complicating childbirth: Principal | ICD-10-CM | POA: Diagnosis present

## 2016-12-18 DIAGNOSIS — K219 Gastro-esophageal reflux disease without esophagitis: Secondary | ICD-10-CM | POA: Diagnosis present

## 2016-12-18 DIAGNOSIS — Z8249 Family history of ischemic heart disease and other diseases of the circulatory system: Secondary | ICD-10-CM

## 2016-12-18 DIAGNOSIS — R030 Elevated blood-pressure reading, without diagnosis of hypertension: Secondary | ICD-10-CM | POA: Diagnosis present

## 2016-12-18 DIAGNOSIS — O1413 Severe pre-eclampsia, third trimester: Secondary | ICD-10-CM | POA: Diagnosis present

## 2016-12-18 DIAGNOSIS — O9902 Anemia complicating childbirth: Secondary | ICD-10-CM | POA: Diagnosis present

## 2016-12-18 DIAGNOSIS — Z833 Family history of diabetes mellitus: Secondary | ICD-10-CM

## 2016-12-18 DIAGNOSIS — Z880 Allergy status to penicillin: Secondary | ICD-10-CM

## 2016-12-18 DIAGNOSIS — O34211 Maternal care for low transverse scar from previous cesarean delivery: Secondary | ICD-10-CM | POA: Diagnosis present

## 2016-12-18 DIAGNOSIS — O9962 Diseases of the digestive system complicating childbirth: Secondary | ICD-10-CM | POA: Diagnosis present

## 2016-12-18 DIAGNOSIS — E669 Obesity, unspecified: Secondary | ICD-10-CM | POA: Diagnosis present

## 2016-12-18 DIAGNOSIS — J45909 Unspecified asthma, uncomplicated: Secondary | ICD-10-CM | POA: Diagnosis present

## 2016-12-18 DIAGNOSIS — Z6834 Body mass index (BMI) 34.0-34.9, adult: Secondary | ICD-10-CM | POA: Diagnosis not present

## 2016-12-18 LAB — COMPREHENSIVE METABOLIC PANEL
ALBUMIN: 2.9 g/dL — AB (ref 3.5–5.0)
ALK PHOS: 124 U/L (ref 38–126)
ALT: 13 U/L — ABNORMAL LOW (ref 14–54)
AST: 16 U/L (ref 15–41)
Anion gap: 7 (ref 5–15)
BILIRUBIN TOTAL: 0.5 mg/dL (ref 0.3–1.2)
BUN: 9 mg/dL (ref 6–20)
CALCIUM: 8.6 mg/dL — AB (ref 8.9–10.3)
CO2: 21 mmol/L — ABNORMAL LOW (ref 22–32)
Chloride: 106 mmol/L (ref 101–111)
Creatinine, Ser: 0.56 mg/dL (ref 0.44–1.00)
GFR calc Af Amer: >60 mL/min (ref >60)
GLUCOSE: 82 mg/dL (ref 65–99)
POTASSIUM: 3.8 mmol/L (ref 3.5–5.1)
Sodium: 134 mmol/L — ABNORMAL LOW (ref 135–145)
Total Protein: 6.8 g/dL (ref 6.5–8.1)

## 2016-12-18 LAB — CBC
HCT: 30.9 % — ABNORMAL LOW (ref 35.0–47.0)
Hemoglobin: 10.4 g/dL — ABNORMAL LOW (ref 12.0–16.0)
MCH: 26.4 pg (ref 26.0–34.0)
MCHC: 33.8 g/dL (ref 32.0–36.0)
MCV: 77.9 fL — AB (ref 80.0–100.0)
Platelets: 276 10*3/uL (ref 150–440)
RBC: 3.96 MIL/uL (ref 3.80–5.20)
RDW: 14.2 % (ref 11.5–14.5)
WBC: 11.8 10*3/uL — ABNORMAL HIGH (ref 3.6–11.0)

## 2016-12-18 LAB — PROTEIN / CREATININE RATIO, URINE
CREATININE, URINE: 71 mg/dL
Protein Creatinine Ratio: 0.23 mg/mg{Cre} — ABNORMAL HIGH (ref 0.00–0.15)
Total Protein, Urine: 16 mg/dL

## 2016-12-18 SURGERY — Surgical Case
Anesthesia: Spinal | Site: Abdomen | Wound class: Clean Contaminated

## 2016-12-18 MED ORDER — MORPHINE SULFATE-NACL 0.5-0.9 MG/ML-% IV SOSY
PREFILLED_SYRINGE | INTRAVENOUS | Status: DC | PRN
  Administered 2016-12-18: .1 mg via EPIDURAL

## 2016-12-18 MED ORDER — ONDANSETRON HCL 4 MG/2ML IJ SOLN: 4.0000 mg | Freq: Once | INTRAMUSCULAR | Status: DC | PRN

## 2016-12-18 MED ORDER — LACTATED RINGERS IV SOLN
INTRAVENOUS | Status: DC | PRN
  Administered 2016-12-18 (×2): via INTRAVENOUS

## 2016-12-18 MED ORDER — MIDAZOLAM HCL 2 MG/2ML IJ SOLN
INTRAMUSCULAR | Status: DC | PRN
  Administered 2016-12-18 (×2): 1 mg via INTRAVENOUS

## 2016-12-18 MED ORDER — CLINDAMYCIN PHOSPHATE 900 MG/50ML IV SOLN
900.0000 mg | INTRAVENOUS | Status: AC
  Administered 2016-12-18: 900 mg via INTRAVENOUS
  Filled 2016-12-18: qty 50

## 2016-12-18 MED ORDER — MAGNESIUM SULFATE 4 GM/100ML IV SOLN
INTRAVENOUS | Status: AC
  Administered 2016-12-18: 4 g
  Filled 2016-12-18: qty 100

## 2016-12-18 MED ORDER — BUPIVACAINE HCL (PF) 0.5 % IJ SOLN
5.0000 mL | Freq: Once | INTRAMUSCULAR | Status: DC
  Filled 2016-12-18: qty 30

## 2016-12-18 MED ORDER — OXYTOCIN 40 UNITS IN LACTATED RINGERS INFUSION - SIMPLE MED
INTRAVENOUS | Status: DC | PRN
  Administered 2016-12-18: 1 mL via INTRAVENOUS
  Administered 2016-12-19: 01:00:00
  Administered 2016-12-19: 299 mL via INTRAVENOUS

## 2016-12-18 MED ORDER — FENTANYL CITRATE (PF) 100 MCG/2ML IJ SOLN: 25.0000 ug | INTRAMUSCULAR | Status: DC | PRN

## 2016-12-18 MED ORDER — SOD CITRATE-CITRIC ACID 500-334 MG/5ML PO SOLN
30.0000 mL | ORAL | Status: DC
  Filled 2016-12-18: qty 30

## 2016-12-18 MED ORDER — BUPIVACAINE HCL 0.5 % IJ SOLN
INTRAMUSCULAR | Status: DC | PRN
  Administered 2016-12-18: 10 mL

## 2016-12-18 MED ORDER — BUPIVACAINE 0.25 % ON-Q PUMP DUAL CATH 400 ML
400.0000 mL | INJECTION | Status: DC
  Filled 2016-12-18 (×2): qty 400

## 2016-12-18 MED ORDER — HEPARIN SODIUM (PORCINE) 10000 UNIT/ML IJ SOLN
INTRAMUSCULAR | Status: AC
  Filled 2016-12-18: qty 3

## 2016-12-18 MED ORDER — MIDAZOLAM HCL 2 MG/2ML IJ SOLN
INTRAMUSCULAR | Status: AC
  Filled 2016-12-18: qty 2

## 2016-12-18 MED ORDER — PROPOFOL 10 MG/ML IV BOLUS
INTRAVENOUS | Status: AC
  Filled 2016-12-18: qty 20

## 2016-12-18 MED ORDER — MORPHINE SULFATE (PF) 0.5 MG/ML IJ SOLN
INTRAMUSCULAR | Status: AC
  Filled 2016-12-18: qty 10

## 2016-12-18 MED ORDER — DEXAMETHASONE SODIUM PHOSPHATE 10 MG/ML IJ SOLN
INTRAMUSCULAR | Status: AC
Start: 2016-12-18 — End: 2016-12-18
  Filled 2016-12-18: qty 1

## 2016-12-18 MED ORDER — MAGNESIUM SULFATE BOLUS VIA INFUSION
4.0000 g | Freq: Once | INTRAVENOUS | Status: DC
  Filled 2016-12-18: qty 500

## 2016-12-18 MED ORDER — ONDANSETRON HCL 4 MG/2ML IJ SOLN
INTRAMUSCULAR | Status: DC | PRN
Start: 2016-12-18 — End: 2016-12-19
  Administered 2016-12-18: 4 mg via INTRAVENOUS

## 2016-12-18 MED ORDER — LACTATED RINGERS IV SOLN
2.0000 g/h | INTRAVENOUS | Status: DC
  Administered 2016-12-18: 2 g/h via INTRAVENOUS
  Filled 2016-12-18: qty 80

## 2016-12-18 MED ORDER — HEPARIN SODIUM (PORCINE) 10000 UNIT/ML IJ SOLN
INTRAMUSCULAR | Status: DC | PRN
  Administered 2016-12-18: 30000 [IU] via SUBCUTANEOUS

## 2016-12-18 MED ORDER — BUPIVACAINE HCL (PF) 0.5 % IJ SOLN
5.0000 mL | Freq: Once | INTRAMUSCULAR | Status: DC
Start: 2016-12-18 — End: 2016-12-19

## 2016-12-18 MED ORDER — GENTAMICIN SULFATE 40 MG/ML IJ SOLN
INTRAVENOUS | Status: DC | PRN
  Administered 2016-12-18: 120 mg via INTRAVENOUS

## 2016-12-18 MED ORDER — DEXAMETHASONE SODIUM PHOSPHATE 10 MG/ML IJ SOLN
INTRAMUSCULAR | Status: DC | PRN
  Administered 2016-12-18: 10 mg via INTRAVENOUS

## 2016-12-18 MED ORDER — GENTAMICIN SULFATE 40 MG/ML IJ SOLN
120.0000 mg | INTRAVENOUS | Status: DC
  Filled 2016-12-18: qty 3

## 2016-12-18 MED ORDER — OXYTOCIN 40 UNITS IN LACTATED RINGERS INFUSION - SIMPLE MED
INTRAVENOUS | Status: AC
  Filled 2016-12-18: qty 1000

## 2016-12-18 SURGICAL SUPPLY — 37 items
CANISTER SUCT 3000ML (MISCELLANEOUS) ×6 IMPLANT
CATH KIT ON-Q SILVERSOAK 5IN (CATHETERS) ×6 IMPLANT
CELL SAVER ADDITIONAL TIME PER (MISCELLANEOUS) ×405
CELL SAVER COLL SVCS (MISCELLANEOUS) ×3
CLOSURE WOUND 1/2 X4 (GAUZE/BANDAGES/DRESSINGS)
DERMABOND ADVANCED (GAUZE/BANDAGES/DRESSINGS) ×4
DERMABOND ADVANCED .7 DNX12 (GAUZE/BANDAGES/DRESSINGS) ×2 IMPLANT
DRSG OPSITE POSTOP 4X10 (GAUZE/BANDAGES/DRESSINGS) ×3 IMPLANT
DRSG TELFA 3X8 NADH (GAUZE/BANDAGES/DRESSINGS) IMPLANT
ELECT CAUTERY BLADE 6.4 (BLADE) ×3 IMPLANT
ELECT REM PT RETURN 9FT ADLT (ELECTROSURGICAL) ×3
ELECTRODE REM PT RTRN 9FT ADLT (ELECTROSURGICAL) ×1 IMPLANT
GAUZE SPONGE 4X4 12PLY STRL (GAUZE/BANDAGES/DRESSINGS) IMPLANT
GLOVE BIO SURGEON STRL SZ7 (GLOVE) ×12 IMPLANT
GLOVE INDICATOR 7.5 STRL GRN (GLOVE) ×12 IMPLANT
GOWN STRL REUS W/ TWL LRG LVL3 (GOWN DISPOSABLE) ×4 IMPLANT
GOWN STRL REUS W/TWL LRG LVL3 (GOWN DISPOSABLE) ×8
LIQUID BAND (GAUZE/BANDAGES/DRESSINGS) IMPLANT
NS IRRIG 1000ML POUR BTL (IV SOLUTION) ×3 IMPLANT
PACK C SECTION AR (MISCELLANEOUS) ×3 IMPLANT
PAD OB MATERNITY 4.3X12.25 (PERSONAL CARE ITEMS) ×3 IMPLANT
PAD PREP 24X41 OB/GYN DISP (PERSONAL CARE ITEMS) ×3 IMPLANT
SAVER CELL COLL SVCS (MISCELLANEOUS) ×1 IMPLANT
SPONGE LAP 18X18 5 PK (GAUZE/BANDAGES/DRESSINGS) ×3 IMPLANT
STRIP CLOSURE SKIN 1/2X4 (GAUZE/BANDAGES/DRESSINGS) IMPLANT
SUCT VACUUM KIWI BELL (SUCTIONS) ×3 IMPLANT
SUT CHROMIC GUT BROWN 0 54 (SUTURE) IMPLANT
SUT CHROMIC GUT BROWN 0 54IN (SUTURE)
SUT MNCRL 4-0 (SUTURE) ×2
SUT MNCRL 4-0 27XMFL (SUTURE) ×1
SUT PDS AB 1 TP1 96 (SUTURE) ×3 IMPLANT
SUT PLAIN 2 0 XLH (SUTURE) ×3 IMPLANT
SUT VIC AB 0 CT1 36 (SUTURE) ×9 IMPLANT
SUT VIC AB 3-0 SH 8-18 (SUTURE) ×3 IMPLANT
SUTURE MNCRL 4-0 27XMF (SUTURE) ×1 IMPLANT
SWABSTK COMLB BENZOIN TINCTURE (MISCELLANEOUS) IMPLANT
TIME ADDITIONAL PER CELL SAVER (MISCELLANEOUS) ×135 IMPLANT

## 2016-12-18 NOTE — H&P (Signed)
OB History & Physical   History of Present Illness:  Chief Complaint: sent over from office for elevated BPs, headache  HPI:  Angela Morton is a 31 y.o. G3P1001 female at 486w1d dated by 6 week ultrasound.  Her pregnancy has been complicated by history of cesarean delivery x 2, obesity (initial BMI 34, total weight gain 25 pounds), failed 1 hour gtt (passed 3 hour gtt), anemia (hgb 10.5 at 28 wks).    She denies contractions.   She denies leakage of fluid.   She denies vaginal bleeding.   She reports fetal movement.   She was sent over from clinic this afternoon for an elevated blood pressure reading with headaches and visual changes. She presents with the same, including headaches and seeing spots.  She denies RUQ pain, chest pain, trouble breathing.  Maternal Medical History:   Past Medical History:  Diagnosis Date  . Asthma   . GERD (gastroesophageal reflux disease)   . Headache     Past Surgical History:  Procedure Laterality Date  . CESAREAN SECTION N/A 04/14/2015   Procedure: CESAREAN SECTION;  Surgeon: Elenora Fenderhelsea C Ward, MD;  Location: [facility] ORS;  Service: Obstetrics;  Laterality: N/A;  . KNEE SURGERY    . knees     bil arthroscopy    Allergies  Allergen Reactions  . Amoxicillin Shortness Of Breath and Other (See Comments)    Reaction:  Asthma attack  . Penicillins Shortness Of Breath and Other (See Comments)    Reaction:  Asthma attack    Prior to Admission medications   Medication Sig Start Date End Date Taking? Authorizing Provider  albuterol (PROVENTIL HFA;VENTOLIN HFA) 108 (90 BASE) MCG/ACT inhaler Inhale 2 puffs into the lungs every 6 (six) hours as needed for wheezing or shortness of breath.   Yes Historical Provider, MD  Prenatal Vit-Fe Fumarate-FA (PRENATAL MULTIVITAMIN) TABS tablet Take 1 tablet by mouth daily.   Yes Historical Provider, MD  ranitidine (ZANTAC) 150 MG capsule Take 150 mg by mouth 2 (two) times daily.   Yes Historical Provider, MD    OB History   Gravida Para Term Preterm AB Living  3 2 1     1   SAB TAB Ectopic Multiple Live Births        0 1    # Outcome Date GA Lbr Len/2nd Weight Sex Delivery Anes PTL Lv  3 Current           2 Para 04/14/15   7 lb 10 oz (3.459 kg) M CS-LVertical Spinal  LIV  1 Term 02/19/13     CS-Unspec         Prenatal care site: [facility]  Social History: She  reports that she has never smoked. She has never used smokeless tobacco. She reports that she does not drink alcohol or use drugs.  Family History: family history includes Colon polyps in her father; Diabetes in her father; Heart disease in her paternal grandmother; Hypertension in her father; Rectal cancer in her other.   Review of Systems: Negative x 10 systems reviewed except as noted in the HPI.    Physical Exam:  Vital Signs: BP (!) 156/94   Pulse (!) 102   Temp 97.7 F (36.5 C) (Oral)   Resp 16   Ht 5\' 6"  (1.676 m)   Wt 241 lb (109.3 kg)   BMI 38.90 kg/m   BP range: 136-171/71-94  Constitutional: Well nourished, well developed female in no acute distress.  HEENT: normal Skin: Warm and  dry.  Cardiovascular: Regular rate and rhythm.   Extremity: trace to 1+ bilateral pedal edema Respiratory: Clear to auscultation bilateral. Normal respiratory effort Abdomen: gravid, non-tender Back: no CVAT Neuro: DTRs 2+, Cranial nerves grossly intact Psych: Alert and Oriented x3. No memory deficits. Normal mood and affect.  MS: normal gait, normal bilateral lower extremity ROM/strength/stability.   Pertinent Results:  Prenatal Labs: Blood type/Rh O positive  Antibody screen negative  Rubella Immune  Varicella Immune    RPR NR  HBsAg negative  HIV negative  GC negative  Chlamydia negative  Genetic screening NIPT diploid XX  1 hour GTT 145 at 28 wks  3 hour GTT 74, 168, 145, 105 (all normal)  GBS negative on 12/11/16   Baseline FHR: 130 beats/min   Variability: moderate   Accelerations: present   Decelerations:  absent Contractions: present frequency: infrequent Overall assessment: category 1     Assessment:  Angela Morton is a 31 y.o. G8P1001 female at [redacted]w[redacted]d with preeclampsia with severe features (Blood pressure and symptoms).   Plan:  1. Admit to Labor & Delivery  2. CBC, T&S, NPO, IVF 3. GBS negative.   4. Fetwal well-being: reassuring 5. Preeclampsia with severe features: magnesium sulfate pre-operatively and/or immediately post-operatively 6. To OR for repeat cesarean delivery. Patient is Jehovah's Witness. Does not accept blood products or components.  Is accepting of Cell Saver technology.  Will arrange for OR.  I have already contacted the OR regarding this.    Conard Novak, MD 12/18/2016 5:13 PM

## 2016-12-18 NOTE — Anesthesia Preprocedure Evaluation (Signed)
Anesthesia Evaluation  Patient identified by MRN, date of birth, ID band Patient awake    Reviewed: Allergy & Precautions, NPO status , Patient's Chart, lab work & pertinent test results  Airway Mallampati: III  TM Distance: >3 FB     Dental  (+) Chipped   Pulmonary asthma ,           Cardiovascular hypertension,      Neuro/Psych  Headaches, negative psych ROS   GI/Hepatic Neg liver ROS, GERD  ,  Endo/Other  negative endocrine ROS  Renal/GU negative Renal ROS  negative genitourinary   Musculoskeletal negative musculoskeletal ROS (+)   Abdominal   Peds  Hematology negative hematology ROS (+)   Anesthesia Other Findings Obesity.Past Medical History: No date: Asthma No date: GERD (gastroesophageal reflux disease) No date: Headache  Pre-eclampsia  Reproductive/Obstetrics (+) Pregnancy                             Anesthesia Physical  Anesthesia Plan  ASA: III  Anesthesia Plan: Spinal   Post-op Pain Management:    Induction:   Airway Management Planned: Nasal Cannula  Additional Equipment:   Intra-op Plan:   Post-operative Plan:   Informed Consent: I have reviewed the patients History and Physical, chart, labs and discussed the procedure including the risks, benefits and alternatives for the proposed anesthesia with the patient or authorized representative who has indicated his/her understanding and acceptance.     Plan Discussed with: CRNA  Anesthesia Plan Comments:         Anesthesia Quick Evaluation

## 2016-12-18 NOTE — Anesthesia Procedure Notes (Signed)
Spinal  Patient location during procedure: OR Start time: 12/18/2016 10:35 PM End time: 12/18/2016 10:39 PM Staffing Anesthesiologist: Yves DillARROLL, Torrie Lafavor Preanesthetic Checklist Completed: patient identified, site marked, surgical consent, pre-op evaluation, timeout performed, IV checked, risks and benefits discussed and monitors and equipment checked Spinal Block Patient position: sitting Prep: Betadine and site prepped and draped Patient monitoring: heart rate, cardiac monitor, continuous pulse ox and blood pressure Approach: midline Location: L3-4 Injection technique: single-shot Needle Needle type: Whitacre  Needle gauge: 25 G Needle length: 9 cm Assessment Sensory level: T4 Additional Notes Time out called.  Patient placed in sitting position.  Back prepped and draped in sterile fashion.  A skin wheal was made in the L3-L4 interspace with 1% Lidocaine plain.  A 25 G Whitacre needle was advanced with the return of clear, colorless CSF in all 4 quadrants.  No blood or paresthesias.  13 mg of spinal marcaine + epi wash + 0.1 mg of Astromorph was injected.  Patient tolerated the procedure well

## 2016-12-18 NOTE — Anesthesia Preprocedure Evaluation (Deleted)
Anesthesia Evaluation  Patient identified by MRN, date of birth, ID band Patient awake    Reviewed: Allergy & Precautions, NPO status , Patient's Chart, lab work & pertinent test results  Airway Mallampati: III  TM Distance: >3 FB     Dental  (+) Chipped   Pulmonary asthma ,    Pulmonary exam normal        Cardiovascular hypertension, Normal cardiovascular exam     Neuro/Psych  Headaches, negative psych ROS   GI/Hepatic Neg liver ROS, GERD  ,  Endo/Other  negative endocrine ROS  Renal/GU negative Renal ROS  negative genitourinary   Musculoskeletal negative musculoskeletal ROS (+)   Abdominal Normal abdominal exam  (+)   Peds negative pediatric ROS (+)  Hematology negative hematology ROS (+)   Anesthesia Other Findings Obesity.Past Medical History: No date: Asthma No date: GERD (gastroesophageal reflux disease) No date: Headache  Pre-eclampsia  Reproductive/Obstetrics (+) Pregnancy                             Anesthesia Physical  Anesthesia Plan  ASA: III  Anesthesia Plan: Spinal   Post-op Pain Management:    Induction: Intravenous  Airway Management Planned: Nasal Cannula  Additional Equipment:   Intra-op Plan:   Post-operative Plan:   Informed Consent: I have reviewed the patients History and Physical, chart, labs and discussed the procedure including the risks, benefits and alternatives for the proposed anesthesia with the patient or authorized representative who has indicated his/her understanding and acceptance.   Dental advisory given  Plan Discussed with: CRNA and Surgeon  Anesthesia Plan Comments:         Anesthesia Quick Evaluation

## 2016-12-18 NOTE — Anesthesia Post-op Follow-up Note (Cosign Needed)
Anesthesia QCDR form completed.        

## 2016-12-18 NOTE — OB Triage Note (Signed)
Pt G3P2 1546w1d [facility] for elevated BP. BP: 148/86, 126/79, 141/88. Pt denies sharp pain in RUQ of abdomen. Pt states she has headache, blurry vision, and seeing spots. Plus 1 reflexes, absent clonus. Pt states contractions q4558m apart for the past couple of hours. Rates pain 3/10 in mid/lower abdomen. Pt states that contractions have spaced out since being here. Pt states + FM and denies leaking or gush of fluid.

## 2016-12-18 NOTE — Op Note (Signed)
Cesarean Section Operative Note    Angela LindauMegan Morton   12/18/2016   Pre-operative Diagnosis:  1) intrauterine pregnancy at 3352w1d  2) preeclampsia with severe features 3) history of cesarean section, desires repeat   Post-operative Diagnosis:  1) intrauterine pregnancy at 3752w1d  2) preeclampsia with severe features 3) history of cesarean section, desires repeat  Procedure: repeat low transverse cesarean section via pfannenstiel incision with double layer uterine closure  Surgeon: Surgeon(s) and Role:    * Conard NovakStephen D Caoilainn Sacks, MD - Primary   [facility]  Anesthesia: spinal   Findings:  1) normal appearing gravid uterus, fallopian tubes, and ovaries 2) filmy adhesions of bladder to lower uterine segment 3) viable female infant with weight of 3,510 grams and APGARs of 8 and 9   Estimated Blood Loss: 750 mL  Total IV Fluids: 1,000 ml crystalloid  Urine Output: 150 mL clear urine at the end of the procedure  Specimens: None  Complications: no complications  Disposition: PACU - hemodynamically stable.   Maternal Condition: stable   Baby condition / location:  Couplet care / Skin to Skin  Procedure Details:  The patient was seen in the Holding Room. The risks, benefits, complications, treatment options, and expected outcomes were discussed with the patient. The patient concurred with the proposed plan, giving informed consent. identified as Angela LindauMegan Morton and the procedure verified as C-Section Delivery. A Time Out was held and the above information confirmed.   After induction of anesthesia, the patient was draped and prepped in the usual sterile manner. A Pfannenstiel incision was made and carried down through the subcutaneous tissue to the fascia. Fascial incision was made and extended transversely. The fascia was separated from the underlying rectus tissue superiorly and inferiorly. The peritoneum was identified and entered. Peritoneal incision was extended  longitudinally. The bladder flap was sharply freed from the lower uterine segment. A low transverse uterine incision was made and the hysterotomy was extended with cranial-caudal tension. Delivered from cephalic presentation was a 3,510 gram Living newborn infant(s) or Female with Apgar scores of 8 at one minute and 9 at five minutes. The infant rotated up hysterotomy so that the right shoulder presented. The infant was internally rotated to cephalic and a kiwi vacuum was placed on the vertex in correct position and suction applied to the correct level.  The head was easily elevated out of the hysterotomy and the vacuum was removed.  The head and the rest of the body was then delivered.  Cord ph was not sent the umbilical cord was clamped and cut cord blood was obtained for evaluation. The placenta was removed Intact and appeared normal. The uterine outline, tubes and ovaries appeared normal. The uterine incision was closed with running locked sutures of 0 Vicryl.  A second layer of the same suture was thrown in an imbricating fashion.  Hemostasis was assured.  The uterus was returned to the abdomen and the paracolic gutters were cleared of all clots and debris.  The rectus muscles were inspected and found to be hemostatic.  The On-Q catheter pumps were inserted in accordance with the manufacturer's recommendations.  The catheters were inserted approximately 4cm cephelad to the incision line, approximately 1cm apart, straddling the midline.  They were inserted to a depth of the 4th mark. They were positioned superficial to the rectus abdominus muscles and deep to the rectus fascia.    The fascia was then reapproximated with running sutures of 1-0 PDS, looped. The subcutaneous tissue was reapproximated using  2-0 plain gut such that no greater than 2cm of dead space remained. The subcuticular closure was performed using 4-0 monocryl. The skin closure was reinforced using dermabond.  The On-Q catheters were  bolused with 5 mL of 0.5% marcaine plain for a total of 10 mL.  The catheters were affixed to the skin with surgical skin glue, steri-strips, and tegaderm.    Instrument, sponge, and needle counts were correct prior the abdominal closure and were correct at the conclusion of the case.  The patient received clindamycin 900 mg and gentamicin 120 mg IV prior to skin incision (within 30 minutes). For VTE prophylaxis she was wearing SCDs throughout the case.  As she is Jehovah's Witness and refused all blood products, Cell Saver technology was in the room and running during the case. She did not have to receive any return of blood.   Signed: Conard Novak, MD 12/18/2016 11:59 PM

## 2016-12-19 ENCOUNTER — Encounter: Payer: Self-pay | Admitting: Obstetrics and Gynecology

## 2016-12-19 LAB — CBC
HEMATOCRIT: 31.8 % — AB (ref 35.0–47.0)
HEMOGLOBIN: 10.6 g/dL — AB (ref 12.0–16.0)
MCH: 26.2 pg (ref 26.0–34.0)
MCHC: 33.2 g/dL (ref 32.0–36.0)
MCV: 78.8 fL — ABNORMAL LOW (ref 80.0–100.0)
Platelets: 264 10*3/uL (ref 150–440)
RBC: 4.04 MIL/uL (ref 3.80–5.20)
RDW: 14.5 % (ref 11.5–14.5)
WBC: 17.7 10*3/uL — AB (ref 3.6–11.0)

## 2016-12-19 MED ORDER — NALBUPHINE HCL 10 MG/ML IJ SOLN: 5.0000 mg | Freq: Once | INTRAMUSCULAR | Status: DC | PRN

## 2016-12-19 MED ORDER — CALCIUM CARBONATE ANTACID 500 MG PO CHEW
1.0000 | CHEWABLE_TABLET | Freq: Three times a day (TID) | ORAL | Status: DC | PRN
  Administered 2016-12-19 – 2016-12-20 (×2): 200 mg via ORAL
  Filled 2016-12-19 (×2): qty 1

## 2016-12-19 MED ORDER — NALBUPHINE HCL 10 MG/ML IJ SOLN: 5.0000 mg | INTRAMUSCULAR | Status: DC | PRN

## 2016-12-19 MED ORDER — OXYCODONE-ACETAMINOPHEN 5-325 MG PO TABS
ORAL_TABLET | ORAL | Status: AC
  Administered 2016-12-19: 1
  Filled 2016-12-19: qty 1

## 2016-12-19 MED ORDER — ONDANSETRON HCL 4 MG/2ML IJ SOLN: 4.0000 mg | Freq: Three times a day (TID) | INTRAMUSCULAR | Status: DC | PRN

## 2016-12-19 MED ORDER — DIPHENHYDRAMINE HCL 25 MG PO CAPS: 25.0000 mg | ORAL_CAPSULE | ORAL | Status: DC | PRN

## 2016-12-19 MED ORDER — SODIUM CHLORIDE FLUSH 0.9 % IV SOLN
INTRAVENOUS | Status: AC
Start: 2016-12-19 — End: 2016-12-19
  Filled 2016-12-19: qty 10

## 2016-12-19 MED ORDER — DIPHENHYDRAMINE HCL 50 MG/ML IJ SOLN: 12.5000 mg | INTRAMUSCULAR | Status: DC | PRN

## 2016-12-19 MED ORDER — PRENATAL MULTIVITAMIN CH
1.0000 | ORAL_TABLET | Freq: Every day | ORAL | Status: DC
  Administered 2016-12-19 – 2016-12-21 (×3): 1 via ORAL
  Filled 2016-12-19 (×3): qty 1

## 2016-12-19 MED ORDER — OXYCODONE-ACETAMINOPHEN 5-325 MG PO TABS
1.0000 | ORAL_TABLET | ORAL | Status: DC | PRN
  Administered 2016-12-21: 1 via ORAL
  Filled 2016-12-19: qty 1

## 2016-12-19 MED ORDER — OXYCODONE-ACETAMINOPHEN 5-325 MG PO TABS
2.0000 | ORAL_TABLET | ORAL | Status: DC | PRN
  Administered 2016-12-19 – 2016-12-21 (×9): 2 via ORAL
  Filled 2016-12-19 (×9): qty 2

## 2016-12-19 MED ORDER — LACTATED RINGERS IV SOLN
INTRAVENOUS | Status: DC
  Administered 2016-12-18 – 2016-12-19 (×2): via INTRAVENOUS

## 2016-12-19 MED ORDER — MENTHOL 3 MG MT LOZG
1.0000 | LOZENGE | OROMUCOSAL | Status: DC | PRN
Start: 2016-12-19 — End: 2016-12-21
  Filled 2016-12-19: qty 9

## 2016-12-19 MED ORDER — MORPHINE SULFATE (PF) 2 MG/ML IV SOLN
1.0000 mg | INTRAVENOUS | Status: DC | PRN
Start: 2016-12-19 — End: 2016-12-20
  Administered 2016-12-19: 1 mg via INTRAVENOUS
  Filled 2016-12-19: qty 1

## 2016-12-19 MED ORDER — DIBUCAINE 1 % RE OINT: 1.0000 "application " | TOPICAL_OINTMENT | RECTAL | Status: DC | PRN

## 2016-12-19 MED ORDER — SENNOSIDES-DOCUSATE SODIUM 8.6-50 MG PO TABS
2.0000 | ORAL_TABLET | ORAL | Status: DC
  Administered 2016-12-19 – 2016-12-20 (×2): 2 via ORAL
  Filled 2016-12-19 (×2): qty 2

## 2016-12-19 MED ORDER — IBUPROFEN 600 MG PO TABS
600.0000 mg | ORAL_TABLET | Freq: Four times a day (QID) | ORAL | Status: DC | PRN
  Administered 2016-12-19: 600 mg via ORAL
  Filled 2016-12-19 (×2): qty 1

## 2016-12-19 MED ORDER — SIMETHICONE 80 MG PO CHEW
80.0000 mg | CHEWABLE_TABLET | Freq: Three times a day (TID) | ORAL | Status: DC
  Administered 2016-12-19 – 2016-12-21 (×7): 80 mg via ORAL
  Filled 2016-12-19 (×7): qty 1

## 2016-12-19 MED ORDER — IBUPROFEN 600 MG PO TABS
600.0000 mg | ORAL_TABLET | Freq: Four times a day (QID) | ORAL | Status: DC
  Administered 2016-12-19 – 2016-12-21 (×8): 600 mg via ORAL
  Filled 2016-12-19 (×7): qty 1

## 2016-12-19 MED ORDER — OXYTOCIN 40 UNITS IN LACTATED RINGERS INFUSION - SIMPLE MED
2.5000 [IU]/h | INTRAVENOUS | Status: AC
  Administered 2016-12-19: 62.5 [IU] via INTRAVENOUS
  Filled 2016-12-19: qty 1000

## 2016-12-19 MED ORDER — SCOPOLAMINE 1 MG/3DAYS TD PT72: 1.0000 | MEDICATED_PATCH | Freq: Once | TRANSDERMAL | Status: DC

## 2016-12-19 MED ORDER — NALOXONE HCL 2 MG/2ML IJ SOSY
1.0000 ug/kg/h | PREFILLED_SYRINGE | INTRAVENOUS | Status: DC | PRN
  Filled 2016-12-19: qty 2

## 2016-12-19 MED ORDER — COCONUT OIL OIL: 1.0000 "application " | TOPICAL_OIL | Status: DC | PRN

## 2016-12-19 MED ORDER — SODIUM CHLORIDE 0.9% FLUSH: 3.0000 mL | INTRAVENOUS | Status: DC | PRN

## 2016-12-19 MED ORDER — BUPIVACAINE ON-Q PAIN PUMP (FOR ORDER SET NO CHG)
INJECTION | Status: DC
  Filled 2016-12-19: qty 1

## 2016-12-19 MED ORDER — MEPERIDINE HCL 25 MG/ML IJ SOLN: 6.2500 mg | INTRAMUSCULAR | Status: DC | PRN

## 2016-12-19 MED ORDER — FERROUS SULFATE 325 (65 FE) MG PO TABS
325.0000 mg | ORAL_TABLET | Freq: Two times a day (BID) | ORAL | Status: DC
  Administered 2016-12-19 – 2016-12-21 (×5): 325 mg via ORAL
  Filled 2016-12-19 (×6): qty 1

## 2016-12-19 MED ORDER — DIPHENHYDRAMINE HCL 25 MG PO CAPS: 25.0000 mg | ORAL_CAPSULE | Freq: Four times a day (QID) | ORAL | Status: DC | PRN

## 2016-12-19 MED ORDER — WITCH HAZEL-GLYCERIN EX PADS: 1.0000 "application " | MEDICATED_PAD | CUTANEOUS | Status: DC | PRN

## 2016-12-19 MED ORDER — NALOXONE HCL 0.4 MG/ML IJ SOLN: 0.4000 mg | INTRAMUSCULAR | Status: DC | PRN

## 2016-12-19 NOTE — Transfer of Care (Signed)
Immediate Anesthesia Transfer of Care Note  Patient: Angela Morton  Procedure(s) Performed: Procedure(s): CESAREAN SECTION (N/A)  Patient Location: PACU  Anesthesia Type:Spinal  Level of Consciousness: awake, alert , oriented and patient cooperative  Airway & Oxygen Therapy: Patient Spontanous Breathing  Post-op Assessment: Report given to RN and Post -op Vital signs reviewed and stable  Post vital signs: Reviewed and stable  Last Vitals:  Vitals:   12/18/16 2036 12/18/16 2037  BP: (!) 160/89   Pulse: (!) 108   Resp:  20  Temp:  37.1 C    Last Pain:  Vitals:   12/18/16 2037  TempSrc: Oral  PainSc:       Patients Stated Pain Goal: 0 (12/18/16 1610)  Complications: No apparent anesthesia complications

## 2016-12-19 NOTE — Anesthesia Post-op Follow-up Note (Signed)
  Anesthesia Pain Follow-up Note  Patient: Angela Morton  Day #: 1  Date of Follow-up: 12/19/2016 Time: 7:34 AM  Last Vitals:  Vitals:   12/19/16 0546 12/19/16 0646  BP: 129/79 133/76  Pulse: 67 74  Resp: 20 18  Temp:      Level of Consciousness: alert  Pain: none   Side Effects:None  Catheter Site Exam:clean, dry, no drainage     Plan: D/C from anesthesia care at surgeon's request  Karoline Caldwelleana Ronon Ferger

## 2016-12-19 NOTE — Progress Notes (Signed)
Admit Date: 12/18/2016 Today's Date: 12/19/2016  Subjective: Postpartum Day 1: Cesarean Delivery Patient reports tolerating PO.  Min pain. Headache and blurry vision resolved right after delivery. Denies, CP, SOB, abd pain.  Mild edema.  Objective: Vital signs in last 24 hours: Temp:  [97.5 F (36.4 C)-98.8 F (37.1 C)] 97.5 F (36.4 C) (02/01 0730) Pulse Rate:  [66-115] 67 (02/01 0946) Resp:  [15-20] 18 (02/01 0730) BP: (108-160)/(53-105) 108/54 (02/01 0946) SpO2:  [95 %-100 %] 96 % (02/01 0525) Weight:  [241 lb (109.3 kg)] 241 lb (109.3 kg) (01/31 1443)  Physical Exam:  General: alert, cooperative and no distress Lochia: appropriate Uterine Fundus: firm Incision: healing well DVT Evaluation: No evidence of DVT seen on physical exam.   Recent Labs  12/18/16 1522 12/19/16 0740  HGB 10.4* 10.6*  HCT 30.9* 31.8*    Assessment/Plan: Status post Cesarean section. Doing well postoperatively.  Continue current care. Preeclampsia resolving quickly based on vitals and sx's. Cont Magnesium sulfate prevention.  Diuresis is going well, likely stopping magnesium and continuing monitoring this afternoon. Mild anemia, Fe and monitor Breast feeding Plans [facility]  Letitia LibraRobert Paul Harris 12/19/2016, 9:57 AM

## 2016-12-19 NOTE — Anesthesia Postprocedure Evaluation (Signed)
Anesthesia Post Note  Patient: Angela Morton  Procedure(s) Performed: Procedure(s) (LRB): CESAREAN SECTION (N/A)  Patient location during evaluation: Mother Baby Anesthesia Type: Spinal Level of consciousness: awake, awake and alert and oriented Pain management: pain level controlled Vital Signs Assessment: post-procedure vital signs reviewed and stable Respiratory status: spontaneous breathing Cardiovascular status: blood pressure returned to baseline Postop Assessment: no headache, no backache and no signs of nausea or vomiting Anesthetic complications: no     Last Vitals:  Vitals:   12/19/16 0546 12/19/16 0646  BP: 129/79 133/76  Pulse: 67 74  Resp: 20 18  Temp:      Last Pain:  Vitals:   12/19/16 0551  TempSrc:   PainSc: 4                  Burlie Cajamarca Lawerance CruelStarr

## 2016-12-19 NOTE — Discharge Summary (Signed)
OB Discharge Summary     Patient Name: Angela Morton DOB: 08/09/1986 MRN: [id number]  Date of admission: 12/18/2016 Delivering MD: Conard Novak, MD  Date of Delivery: 12/18/2016  Date of discharge: 12/21/2016  Admitting diagnosis:  1) intrauterine pregnancy at [redacted]w[redacted]d  2) preeclampsia with severe features 3) previous cesarean delivery  Intrauterine pregnancy: [redacted]w[redacted]d     Secondary diagnosis: Preeclampsia     Discharge diagnosis: Term Pregnancy Delivered, Preeclampsia (severe) and Anemia                                                                                                Post partum procedures:None  Augmentation: N/A  Complications: None  Hospital course:  The patient was admitted for evaluation of elevated blood pressures in the office. She was diagnosed with preeclampsia with severe features. She was taken to the OR for repeat cesarean delivery on 12/18/16.  She was maintained on magnesium sulfate for 24 hours postoperatively.  Her postpartum course was otherwise normal. On POD#3 she was ambulating, tolerating PO, her pain was well-controlled on PO pain medication, and voiding spontaneously.  She was deemed to be appropriate for discharge.   Physical exam  Vitals:   12/20/16 1931 12/21/16 0001 12/21/16 0440 12/21/16 0700  BP: 137/83   128/64  Pulse: 79   69  Resp: 18   16  Temp: 97.5 F (36.4 C) 98 F (36.7 C) 98.7 F (37.1 C) 97.3 F (36.3 C)  TempSrc: Oral Axillary Oral Axillary  SpO2:    100%  Weight:      Height:       General: alert, cooperative and no distress Lochia: appropriate Uterine Fundus: firm Incision: Healing well with no significant drainage, Dressing is clean, dry, and intact DVT Evaluation: No evidence of DVT seen on physical exam. No cords or calf tenderness. No significant calf/ankle edema.  Labs: Lab Results  Component Value Date   WBC 17.7 (H) 12/19/2016   HGB 10.6 (L) 12/19/2016   HCT 31.8 (L) 12/19/2016   MCV 78.8 (L)  12/19/2016   PLT 264 12/19/2016   CMP Latest Ref Rng & Units 12/18/2016  Glucose 65 - 99 mg/dL 82  BUN 6 - 20 mg/dL 9  Creatinine 4.09 - 8.11 mg/dL 9.14  Sodium 782 - 956 mmol/L 134(L)  Potassium 3.5 - 5.1 mmol/L 3.8  Chloride 101 - 111 mmol/L 106  CO2 22 - 32 mmol/L 21(L)  Calcium 8.9 - 10.3 mg/dL 2.1(H)  Total Protein 6.5 - 8.1 g/dL 6.8  Total Bilirubin 0.3 - 1.2 mg/dL 0.5  Alkaline Phos 38 - 126 U/L 124  AST 15 - 41 U/L 16  ALT 14 - 54 U/L 13(L)    Discharge instruction: per After Visit Summary.  Medications:  Allergies as of 12/21/2016      Reactions   Amoxicillin Shortness Of Breath, Other (See Comments)   Reaction:  Asthma attack   Penicillins Shortness Of Breath, Other (See Comments)   Reaction:  Asthma attack   Prednisone       Medication List    TAKE these medications   albuterol 108 (  90 Base) MCG/ACT inhaler Commonly known as:  PROVENTIL HFA;VENTOLIN HFA Inhale 2 puffs into the lungs every 6 (six) hours as needed for wheezing or shortness of breath.   ibuprofen 600 MG tablet Commonly known as:  ADVIL,MOTRIN Take 1 tablet (600 mg total) by mouth every 6 (six) hours.   oxyCODONE-acetaminophen 5-325 MG tablet Commonly known as:  PERCOCET/ROXICET Take 2 tablets by mouth every 4 (four) hours as needed for severe pain (pain score >7/10).   prenatal multivitamin Tabs tablet Take 1 tablet by mouth daily.   ranitidine 150 MG capsule Commonly known as:  ZANTAC Take 150 mg by mouth 2 (two) times daily.       Diet: routine diet  Activity: Advance as tolerated. Pelvic rest for 6 weeks.   Outpatient follow up: Follow-up Information    Conard NovakJackson, Cortlan Dolin D, MD Follow up in 1 week(s).   Specialty:  Obstetrics and Gynecology Why:  post op incision check/BP check Contact information: [address] [place] [address] [phone]             Postpartum contraception: Vasectomy Rhogam Given postpartum: no Rubella vaccine given postpartum:  no Varicella vaccine given postpartum: no TDaP given antepartum or postpartum: AP Flu vaccine given: AP  Newborn Data: Live born female  Birth Weight: 7 lb 11.8 oz (3510 g) APGAR: 8, 9   Baby Feeding: Breast  Disposition:home with mother  SIGNED: Thomasene MohairStephen Ketra Duchesne, MD 12/21/2016 10:43 AM

## 2016-12-20 NOTE — Progress Notes (Signed)
Subjective:  Doing well, no concerns.  Minimal lochia.  No fevers, chills.  Tolerating po.  Good pain control.  No headaches   Objective:  Blood pressure 138/70, pulse 76, temperature 97.6 F (36.4 C), temperature source Axillary, resp. rate 20, height 5' 6"  (1.676 m), weight 241 lb (109.3 kg), SpO2 99 %, unknown if currently breastfeeding.  General: NAD Pulmonary: no increased work of breathing Abdomen: non-distended, non-tender, fundus firm at level of umbilicus Incision: D/C/I Extremities: no edema, no erythema, no tenderness  Results for orders placed or performed during the hospital encounter of 12/18/16 (from the past 72 hour(s))  CBC     Status: Abnormal   Collection Time: 12/18/16  3:22 PM  Result Value Ref Range   WBC 11.8 (H) 3.6 - 11.0 K/uL   RBC 3.96 3.80 - 5.20 MIL/uL   Hemoglobin 10.4 (L) 12.0 - 16.0 g/dL   HCT 30.9 (L) 35.0 - 47.0 %   MCV 77.9 (L) 80.0 - 100.0 fL   MCH 26.4 26.0 - 34.0 pg   MCHC 33.8 32.0 - 36.0 g/dL   RDW 14.2 11.5 - 14.5 %   Platelets 276 150 - 440 K/uL  Comprehensive metabolic panel     Status: Abnormal   Collection Time: 12/18/16  3:22 PM  Result Value Ref Range   Sodium 134 (L) 135 - 145 mmol/L   Potassium 3.8 3.5 - 5.1 mmol/L   Chloride 106 101 - 111 mmol/L   CO2 21 (L) 22 - 32 mmol/L   Glucose, Bld 82 65 - 99 mg/dL   BUN 9 6 - 20 mg/dL   Creatinine, Ser 0.56 0.44 - 1.00 mg/dL   Calcium 8.6 (L) 8.9 - 10.3 mg/dL   Total Protein 6.8 6.5 - 8.1 g/dL   Albumin 2.9 (L) 3.5 - 5.0 g/dL   AST 16 15 - 41 U/L   ALT 13 (L) 14 - 54 U/L   Alkaline Phosphatase 124 38 - 126 U/L   Total Bilirubin 0.5 0.3 - 1.2 mg/dL   GFR calc non Af Amer >60 >60 mL/min   GFR calc Af Amer >60 >60 mL/min    Comment: (NOTE) The eGFR has been calculated using the CKD EPI equation. This calculation has not been validated in all clinical situations. eGFR's persistently <60 mL/min signify possible Chronic Kidney Disease.    Anion gap 7 5 - 15  Protein / creatinine  ratio, urine     Status: Abnormal   Collection Time: 12/18/16  3:24 PM  Result Value Ref Range   Creatinine, Urine 71 mg/dL   Total Protein, Urine 16 mg/dL    Comment: NO NORMAL RANGE ESTABLISHED FOR THIS TEST   Protein Creatinine Ratio 0.23 (H) 0.00 - 0.15 mg/mg[Cre]  CBC     Status: Abnormal   Collection Time: 12/19/16  7:40 AM  Result Value Ref Range   WBC 17.7 (H) 3.6 - 11.0 K/uL   RBC 4.04 3.80 - 5.20 MIL/uL   Hemoglobin 10.6 (L) 12.0 - 16.0 g/dL   HCT 31.8 (L) 35.0 - 47.0 %   MCV 78.8 (L) 80.0 - 100.0 fL   MCH 26.2 26.0 - 34.0 pg   MCHC 33.2 32.0 - 36.0 g/dL   RDW 14.5 11.5 - 14.5 %   Platelets 264 150 - 440 K/uL     Assessment:   31 y.o. [id number] postoperativeday #2 RLTCS, preeclampsia with severe features   Plan:  1) Acute blood loss anemia - hemodynamically stable and asymptomatic - po  ferrous sulfate  2) O pos / RI / VZI  3) TDAP status - received 10/22/16  4) Breast/Condoms  5) Preeclampsia - asymptomatic, normotensive postpartum thus far without need for hypertensives  6) Disposition anticipated discharge POD 3-4

## 2016-12-21 LAB — URINALYSIS, COMPLETE (UACMP) WITH MICROSCOPIC
BILIRUBIN URINE: NEGATIVE
GLUCOSE, UA: NEGATIVE mg/dL
KETONES UR: NEGATIVE mg/dL
NITRITE: NEGATIVE
PH: 6 (ref 5.0–8.0)
Protein, ur: NEGATIVE mg/dL
Specific Gravity, Urine: 1.003 — ABNORMAL LOW (ref 1.005–1.030)

## 2016-12-21 MED ORDER — IBUPROFEN 600 MG PO TABS: 600.0000 mg | ORAL_TABLET | Freq: Four times a day (QID) | ORAL | 0 refills | Status: DC

## 2016-12-21 MED ORDER — OXYCODONE-ACETAMINOPHEN 5-325 MG PO TABS: 2.0000 | ORAL_TABLET | ORAL | 0 refills | Status: DC | PRN

## 2016-12-21 NOTE — Progress Notes (Signed)
Discharge instructions complete and prescriptions given. Patient verbalizes understanding of teaching. Patient discharged home at 1515.

## 2016-12-25 ENCOUNTER — Inpatient Hospital Stay: Admission: RE | Admit: 2016-12-25 | Payer: 59 | Source: Ambulatory Visit

## 2016-12-26 ENCOUNTER — Inpatient Hospital Stay: Admit: 2016-12-26 | Payer: 59 | Admitting: Obstetrics & Gynecology

## 2016-12-26 SURGERY — Surgical Case
Anesthesia: Choice

## 2017-01-31 ENCOUNTER — Encounter: Payer: Self-pay | Admitting: Obstetrics and Gynecology

## 2017-04-01 IMAGING — US US OB FOLLOW-UP
1 series · 14 of 28 positions shown · non-contrast
Comparison: none

[Series 1: us ob follow-up · 0.28mm/px · 14 of 34 slices shown]
[im 2/34]
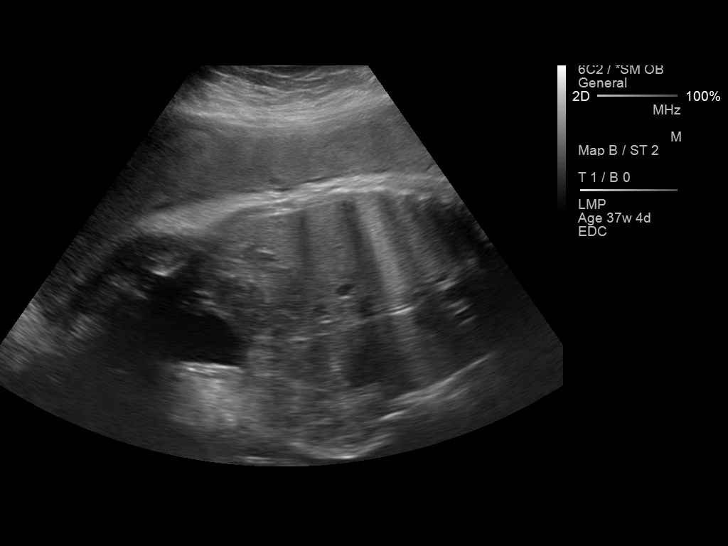
[im 4/34]
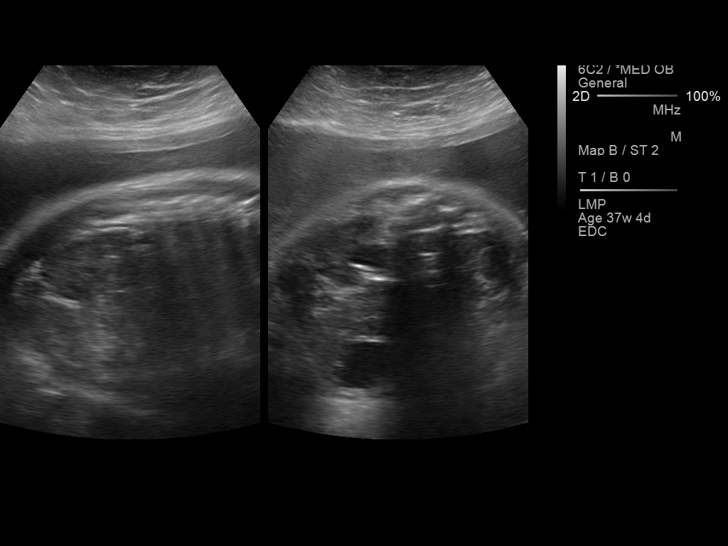
[im 7/34]
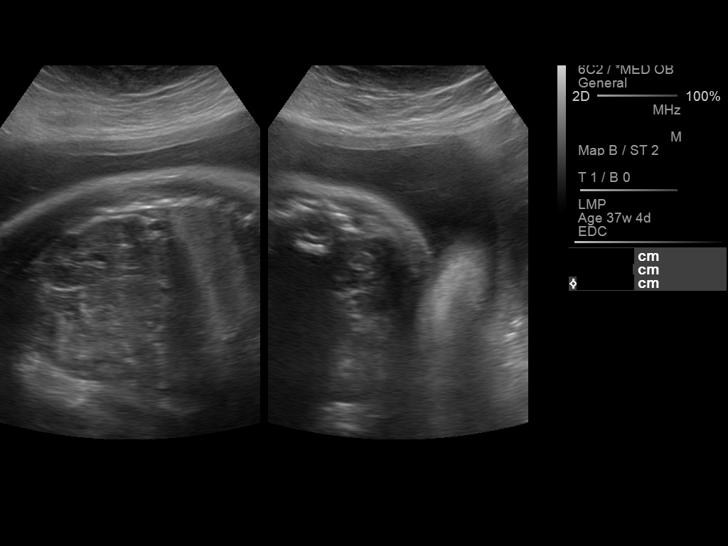
[im 9/34]
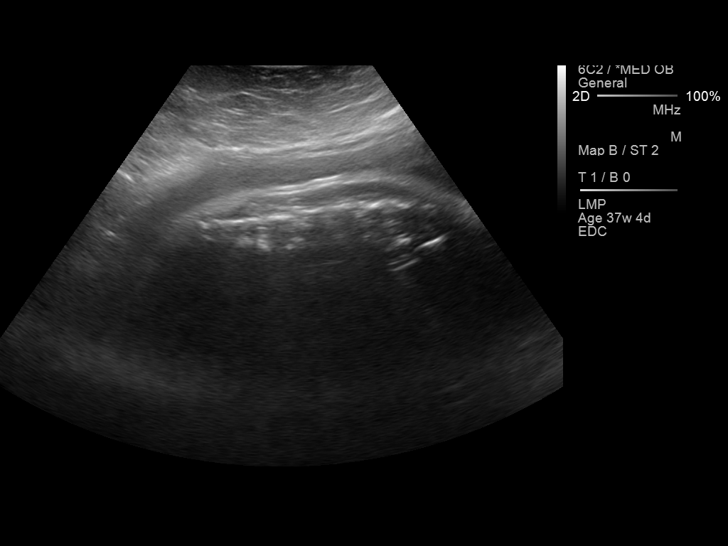
[im 12/34]
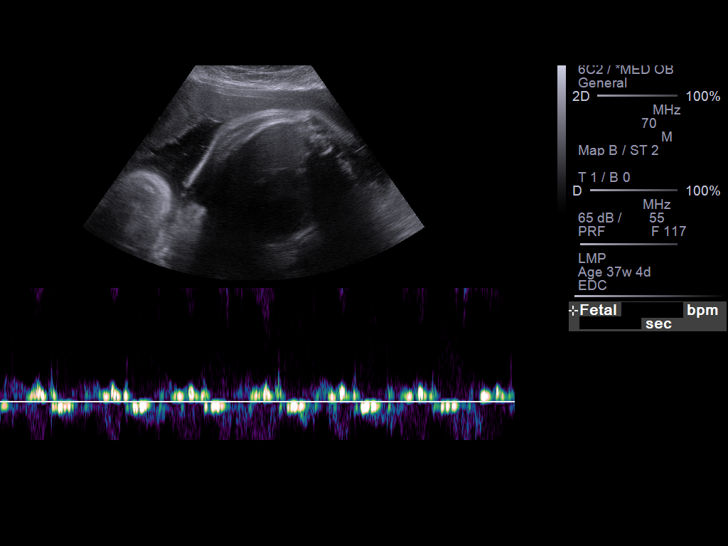
[im 14/34]
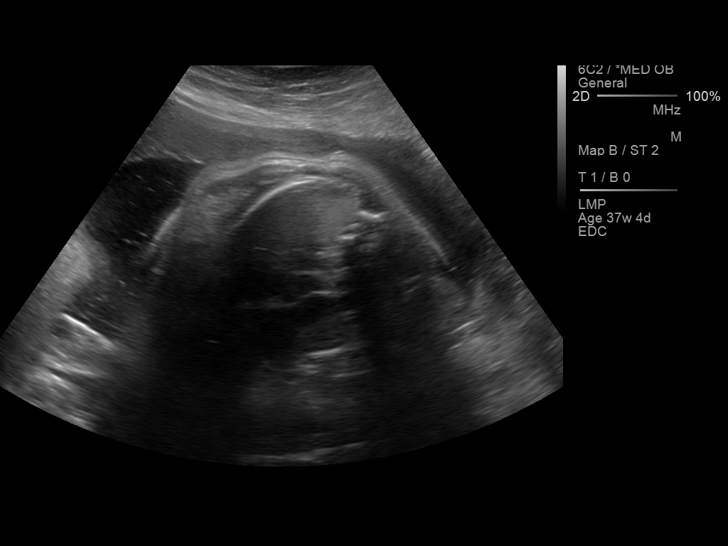
[im 16/34]
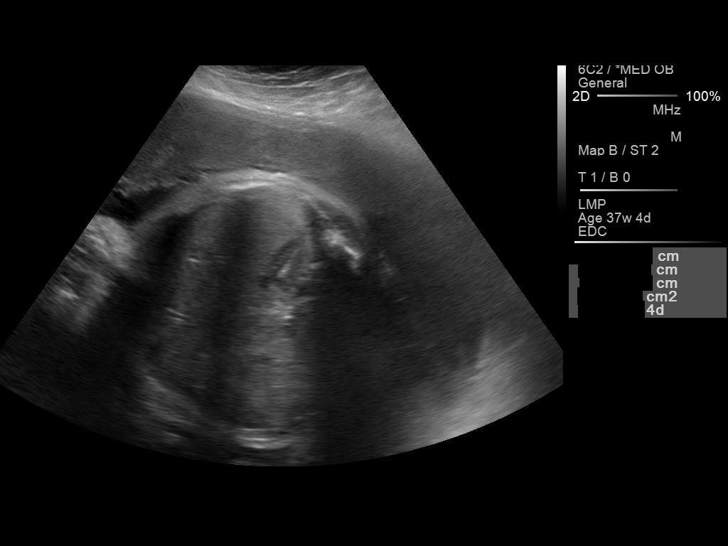
[im 19/34]
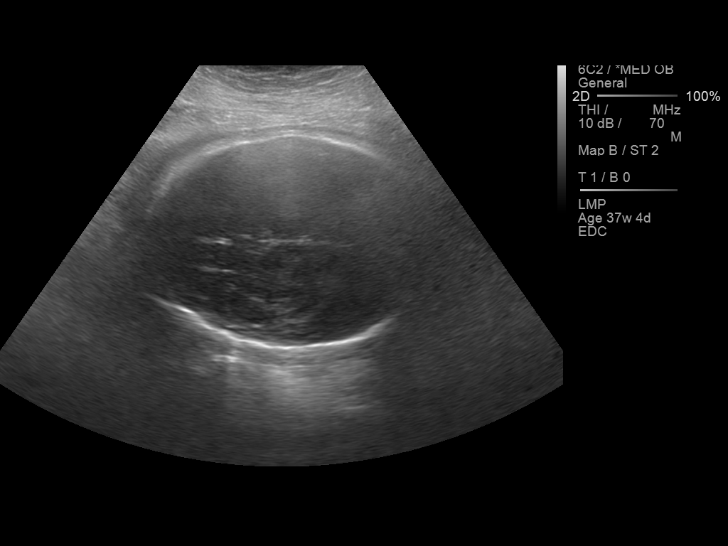
[im 21/34]
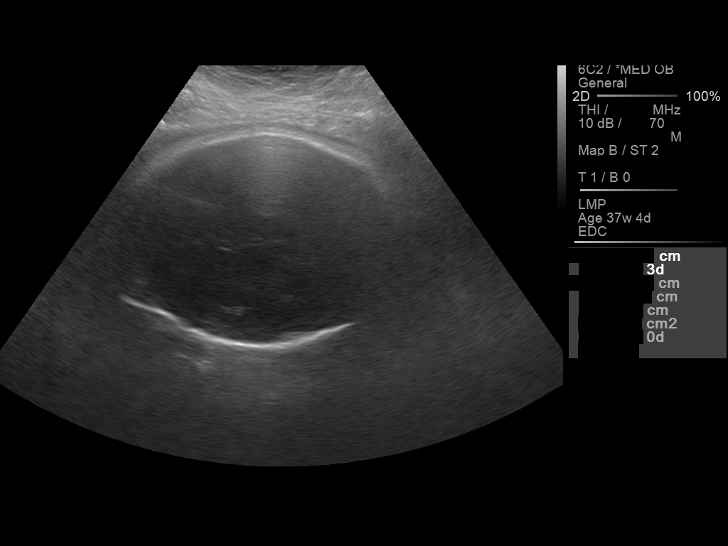
[im 24/34]
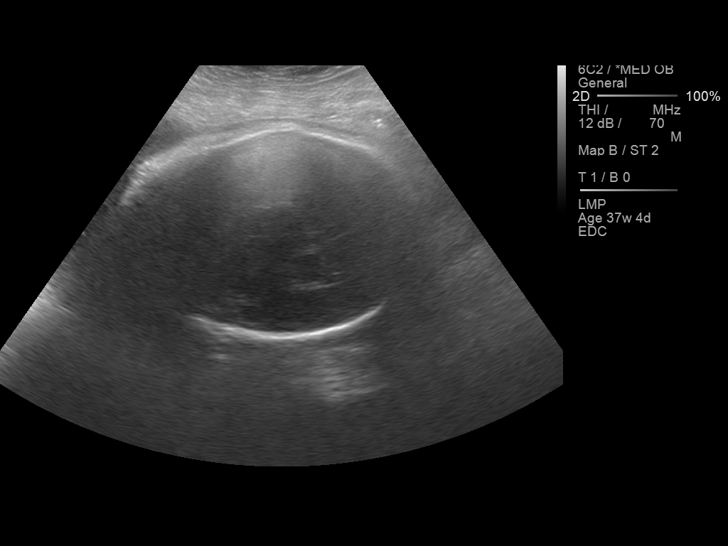
[im 26/34]
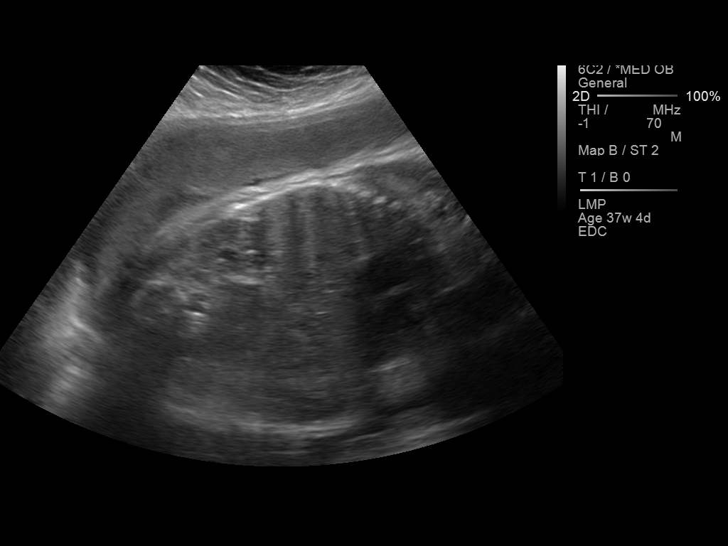
[im 29/34]
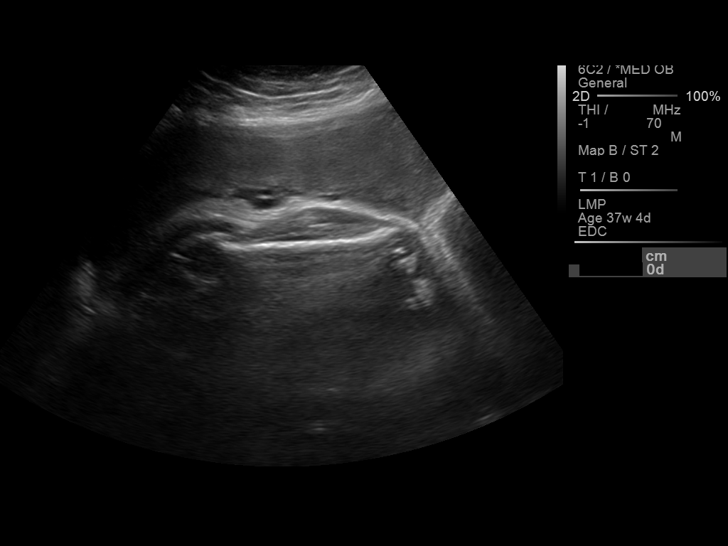
[im 31/34]
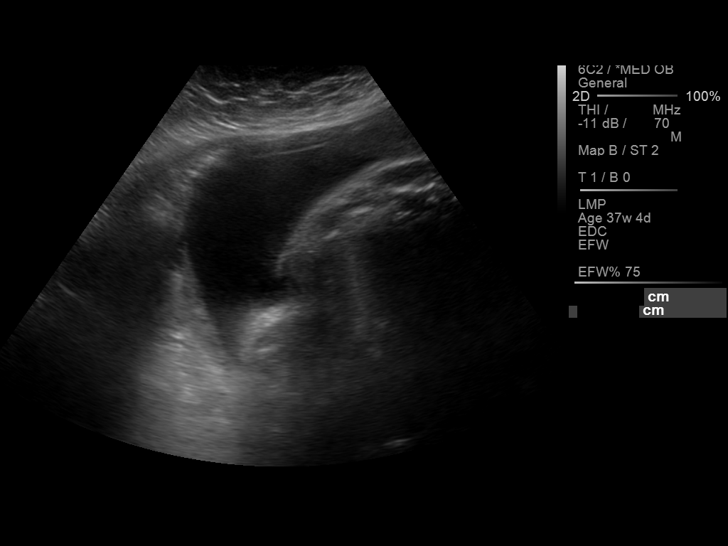
[im 34/34]
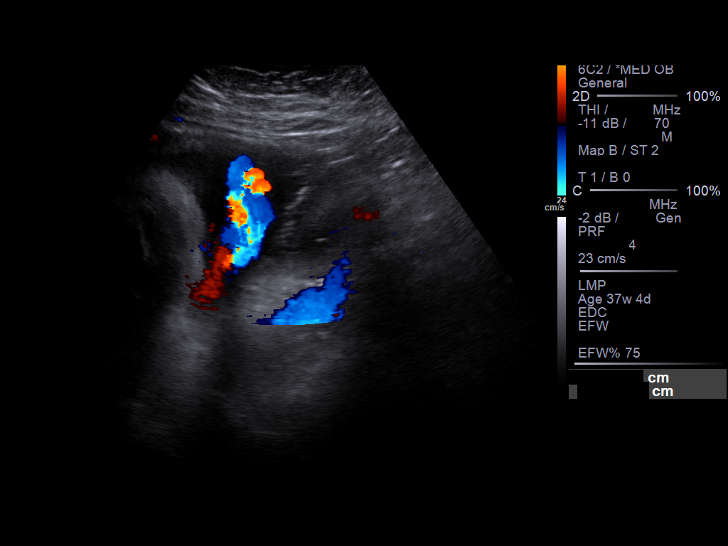

[14 of 28 positions shown; findings below may reference images not displayed]

Canned report from images found in remote index.

Refer to host system for actual result text.

## 2017-04-16 ENCOUNTER — Telehealth: Payer: Self-pay

## 2017-04-16 NOTE — Telephone Encounter (Signed)
Duwayne Heckanielle, RN Case Manager for [facility] calling for delivery info and if pt kept 6wk pp visit.  The # given, [phone] gave me the run around.  There was no option of entering ext.  I finally got a female who could not take the information, transfer me to Danielle's ext.  All he could do was make a note for a case manager to call me back.  The info for Danielle or any other case manager is: no pt did not keep 6wk pp appt., delivered by C/S on 12-18-16, female, 7"11".

## 2018-12-14 ENCOUNTER — Encounter: Payer: Self-pay | Admitting: Emergency Medicine

## 2018-12-14 ENCOUNTER — Emergency Department
Admission: EM | Admit: 2018-12-14 | Discharge: 2018-12-14 | Disposition: A | Discharge status: Home / Self Care | Payer: Managed Care, Other (non HMO) | Attending: Emergency Medicine | Admitting: Emergency Medicine

## 2018-12-14 ENCOUNTER — Other Ambulatory Visit: Payer: Self-pay

## 2018-12-14 DIAGNOSIS — Z79899 Other long term (current) drug therapy: Secondary | ICD-10-CM | POA: Diagnosis not present

## 2018-12-14 DIAGNOSIS — R1084 Generalized abdominal pain: Secondary | ICD-10-CM | POA: Diagnosis present

## 2018-12-14 DIAGNOSIS — Z209 Contact with and (suspected) exposure to unspecified communicable disease: Secondary | ICD-10-CM | POA: Diagnosis not present

## 2018-12-14 DIAGNOSIS — J45909 Unspecified asthma, uncomplicated: Secondary | ICD-10-CM | POA: Insufficient documentation

## 2018-12-14 DIAGNOSIS — A084 Viral intestinal infection, unspecified: Secondary | ICD-10-CM | POA: Insufficient documentation

## 2018-12-14 DIAGNOSIS — R197 Diarrhea, unspecified: Secondary | ICD-10-CM

## 2018-12-14 LAB — COMPREHENSIVE METABOLIC PANEL
ALT: 19 U/L (ref 0–44)
AST: 19 U/L (ref 15–41)
Albumin: 4.2 g/dL (ref 3.5–5.0)
Alkaline Phosphatase: 73 U/L (ref 38–126)
Anion gap: 6 (ref 5–15)
BUN: 9 mg/dL (ref 6–20)
CO2: 26 mmol/L (ref 22–32)
Calcium: 9.6 mg/dL (ref 8.9–10.3)
Chloride: 104 mmol/L (ref 98–111)
Creatinine, Ser: 0.67 mg/dL (ref 0.44–1.00)
GFR calc Af Amer: >60 mL/min (ref >60)
GFR calc non Af Amer: >60 mL/min (ref >60)
Glucose, Bld: 135 mg/dL — ABNORMAL HIGH (ref 70–99)
POTASSIUM: 4.1 mmol/L (ref 3.5–5.1)
Sodium: 136 mmol/L (ref 135–145)
Total Bilirubin: 0.5 mg/dL (ref 0.3–1.2)
Total Protein: 8.5 g/dL — ABNORMAL HIGH (ref 6.5–8.1)

## 2018-12-14 LAB — CBC
HCT: 37.9 % (ref 36.0–46.0)
HEMOGLOBIN: 12 g/dL (ref 12.0–15.0)
MCH: 26.2 pg (ref 26.0–34.0)
MCHC: 31.7 g/dL (ref 30.0–36.0)
MCV: 82.8 fL (ref 80.0–100.0)
Platelets: 442 10*3/uL — ABNORMAL HIGH (ref 150–400)
RBC: 4.58 MIL/uL (ref 3.87–5.11)
RDW: 13.7 % (ref 11.5–15.5)
WBC: 10.7 10*3/uL — ABNORMAL HIGH (ref 4.0–10.5)
nRBC: 0 % (ref 0.0–0.2)

## 2018-12-14 LAB — URINALYSIS, COMPLETE (UACMP) WITH MICROSCOPIC
BILIRUBIN URINE: NEGATIVE
Glucose, UA: NEGATIVE mg/dL
KETONES UR: NEGATIVE mg/dL
Leukocytes, UA: NEGATIVE
NITRITE: NEGATIVE
Protein, ur: NEGATIVE mg/dL
Specific Gravity, Urine: 1.003 — ABNORMAL LOW (ref 1.005–1.030)
WBC, UA: NONE SEEN WBC/hpf (ref 0–5)
pH: 7 (ref 5.0–8.0)

## 2018-12-14 LAB — POCT PREGNANCY, URINE: PREG TEST UR: NEGATIVE

## 2018-12-14 LAB — INFLUENZA PANEL BY PCR (TYPE A & B)
Influenza A By PCR: NEGATIVE
Influenza B By PCR: NEGATIVE

## 2018-12-14 LAB — LIPASE, BLOOD: Lipase: 26 U/L (ref 11–51)

## 2018-12-14 MED ORDER — KETOROLAC TROMETHAMINE 10 MG PO TABS
10.0000 mg | ORAL_TABLET | Freq: Once | ORAL | Status: AC
  Administered 2018-12-14: 10 mg via ORAL
  Filled 2018-12-14: qty 1

## 2018-12-14 MED ORDER — KETOROLAC TROMETHAMINE 10 MG PO TABS: 10.0000 mg | ORAL_TABLET | Freq: Four times a day (QID) | ORAL | 0 refills | Status: DC | PRN

## 2018-12-14 MED ORDER — ONDANSETRON 4 MG PO TBDP
8.0000 mg | ORAL_TABLET | Freq: Once | ORAL | Status: AC
  Administered 2018-12-14: 8 mg via ORAL
  Filled 2018-12-14: qty 2

## 2018-12-14 MED ORDER — LOPERAMIDE HCL 2 MG PO TABS: 4.0000 mg | ORAL_TABLET | Freq: Four times a day (QID) | ORAL | 0 refills | Status: DC | PRN

## 2018-12-14 MED ORDER — ONDANSETRON 4 MG PO TBDP: 4.0000 mg | ORAL_TABLET | Freq: Three times a day (TID) | ORAL | 0 refills | Status: DC | PRN

## 2018-12-14 MED ORDER — ONDANSETRON 4 MG PO TBDP
4.0000 mg | ORAL_TABLET | Freq: Once | ORAL | Status: AC | PRN
  Administered 2018-12-14: 4 mg via ORAL
  Filled 2018-12-14: qty 1

## 2018-12-14 MED ORDER — FAMOTIDINE 20 MG PO TABS: 20.0000 mg | ORAL_TABLET | Freq: Two times a day (BID) | ORAL | 0 refills | Status: DC

## 2018-12-14 MED ORDER — LOPERAMIDE HCL 2 MG PO CAPS
4.0000 mg | ORAL_CAPSULE | Freq: Once | ORAL | Status: AC
  Administered 2018-12-14: 4 mg via ORAL
  Filled 2018-12-14: qty 2

## 2018-12-14 NOTE — ED Provider Notes (Signed)
[facility] Emergency Department Provider Note  ____________________________________________  Time seen: Approximately 3:14 AM  I have reviewed the triage vital signs and the nursing notes.   HISTORY  Chief Complaint Abdominal Pain and Back Pain    HPI Angela Morton is a 33 y.o. female with a history of asthma GERD who complains of generalized abdominal pain mostly on the left side that radiates to her back ongoing for the past 24 hours associated with nausea diarrhea.  Pain is crampy, no aggravating or alleviating factors, no dysuria.  Had a temp of 100.9 at home today.  She reports having a respiratory illness about a week ago, and over the last few days her child has had diarrhea.  She is breast-feeding, but her daughter is about to be 32 years old.      Past Medical History:  Diagnosis Date  . Asthma   . GERD (gastroesophageal reflux disease)   . Headache      Patient Active Problem List   Diagnosis Date Noted  . Preeclampsia, severe, third trimester 12/18/2016  . Delivery by elective cesarean section 04/14/2015  . Cesarean delivery delivered 04/14/2015     Past Surgical History:  Procedure Laterality Date  . CESAREAN SECTION N/A 04/14/2015   Procedure: CESAREAN SECTION;  Surgeon: Elenora Fenderhelsea C Ward, MD;  Location: [facility] ORS;  Service: Obstetrics;  Laterality: N/A;  . CESAREAN SECTION N/A 12/18/2016   Procedure: CESAREAN SECTION;  Surgeon: Conard NovakStephen D Jackson, MD;  Location: [facility] ORS;  Service: Obstetrics;  Laterality: N/A;  . KNEE SURGERY    . knees     bil arthroscopy     Prior to Admission medications   Medication Sig Start Date End Date Taking? Authorizing Provider  albuterol (PROVENTIL HFA;VENTOLIN HFA) 108 (90 BASE) MCG/ACT inhaler Inhale 2 puffs into the lungs every 6 (six) hours as needed for wheezing or shortness of breath.   Yes [provider]  omeprazole (PRILOSEC) 20 MG capsule Take 20 mg by mouth daily.   Yes [provider]  famotidine (PEPCID) 20 MG tablet Take 1 tablet (20 mg total) by mouth 2 (two) times daily. 12/14/18   Sharman CheekStafford, Hugh Kamara, MD  ketorolac (TORADOL) 10 MG tablet Take 1 tablet (10 mg total) by mouth every 6 (six) hours as needed for moderate pain. 12/14/18   Sharman CheekStafford, Layten Aiken, MD  loperamide (IMODIUM A-D) 2 MG tablet Take 2 tablets (4 mg total) by mouth 4 (four) times daily as needed for diarrhea or loose stools. 12/14/18   Sharman CheekStafford, Indiana Gamero, MD  ondansetron (ZOFRAN ODT) 4 MG disintegrating tablet Take 1 tablet (4 mg total) by mouth every 8 (eight) hours as needed for nausea or vomiting. 12/14/18   Sharman CheekStafford, Ellaina Schuler, MD     Allergies Amoxicillin; Penicillins; and Prednisone   Family History  Problem Relation Age of Onset  . Colon polyps Father   . Hypertension Father   . Diabetes Father   . Heart disease Paternal Grandmother   . Rectal cancer Other     Social History Social History   Tobacco Use  . Smoking status: Never Smoker  . Smokeless tobacco: Never Used  Substance Use Topics  . Alcohol use: No  . Drug use: No    Review of Systems  Constitutional:   No fever or chills.  ENT:   No sore throat. No rhinorrhea. Cardiovascular:   No chest pain or syncope. Respiratory:   No dyspnea or cough. Gastrointestinal:   Positive as above for abdominal pain, vomiting  and diarrhea.  Musculoskeletal:   Negative for focal pain or swelling All other systems reviewed and are negative except as documented above in ROS and HPI.  ____________________________________________   PHYSICAL EXAM:  VITAL SIGNS: ED Triage Vitals  Enc Vitals Group     BP 12/14/18 0114 (!) 145/75     Pulse Rate 12/14/18 0114 88     Resp 12/14/18 0114 20     Temp 12/14/18 0114 97.8 F (36.6 C)     Temp Source 12/14/18 0114 Oral     SpO2 12/14/18 0114 100 %     Weight 12/14/18 0115 225 lb (102.1 kg)     Height 12/14/18 0115 5\' 6"  (1.676 m)     Head Circumference --      Peak Flow --      Pain  Score 12/14/18 0123 7     Pain Loc --      Pain Edu? --      Excl. in GC? --     Vital signs reviewed, nursing assessments reviewed.   Constitutional:   Alert and oriented. Non-toxic appearance. Eyes:   Conjunctivae are normal. EOMI. PERRL. ENT      Head:   Normocephalic and atraumatic.      Nose:   No congestion/rhinnorhea.       Mouth/Throat:   MMM, no pharyngeal erythema. No peritonsillar mass.       Neck:   No meningismus. Full ROM. Hematological/Lymphatic/Immunilogical:   No cervical lymphadenopathy. Cardiovascular:   RRR. Symmetric bilateral radial and DP pulses.  No murmurs. Cap refill less than 2 seconds. Respiratory:   Normal respiratory effort without tachypnea/retractions. Breath sounds are clear and equal bilaterally. No wheezes/rales/rhonchi. Gastrointestinal:   Soft without focal tenderness.  No significant tenderness at the right upper quadrant. Non distended. There is no CVA tenderness.  No rebound, rigidity, or guarding. Musculoskeletal:   Normal range of motion in all extremities. No joint effusions.  No lower extremity tenderness.  No edema. Neurologic:   Normal speech and language.  Motor grossly intact. No acute focal neurologic deficits are appreciated.  Skin:    Skin is warm, dry and intact. No rash noted.  No petechiae, purpura, or bullae.  ____________________________________________    LABS (pertinent positives/negatives) (all labs ordered are listed, but only abnormal results are displayed) Labs Reviewed  COMPREHENSIVE METABOLIC PANEL - Abnormal; Notable for the following components:      Result Value   Glucose, Bld 135 (*)    Total Protein 8.5 (*)    All other components within normal limits  CBC - Abnormal; Notable for the following components:   WBC 10.7 (*)    Platelets 442 (*)    All other components within normal limits  URINALYSIS, COMPLETE (UACMP) WITH MICROSCOPIC - Abnormal; Notable for the following components:   Color, Urine COLORLESS  (*)    APPearance CLEAR (*)    Specific Gravity, Urine 1.003 (*)    Hgb urine dipstick SMALL (*)    Bacteria, UA RARE (*)    All other components within normal limits  LIPASE, BLOOD  INFLUENZA PANEL BY PCR (TYPE A & B)  POCT PREGNANCY, URINE  POC URINE PREG, ED   ____________________________________________   EKG    ____________________________________________    RADIOLOGY  No results found.  ____________________________________________   PROCEDURES Procedures  ____________________________________________    CLINICAL IMPRESSION / ASSESSMENT AND PLAN / ED COURSE  Pertinent labs & imaging results that were available during my care of the  patient were reviewed by me and considered in my medical decision making (see chart for details).    Patient presents with abdominal pain vomiting diarrhea consistent with viral gastroenteritis especially given her recent illness and her sick contacts with similar symptoms.Considering the patient's symptoms, medical history, and physical examination today, I have low suspicion for cholecystitis or biliary pathology, pancreatitis, perforation or bowel obstruction, hernia, intra-abdominal abscess, AAA or dissection, volvulus or intussusception, mesenteric ischemia, or appendicitis.  Vital signs are normal, labs are normal.  Symptomatic control with Imodium Zofran Toradol and Pepcid.  Breast-feeding is not a major concern since her child is 32 years old and she nurses just once a day at this point.  Additionally all these medications are not present to a significant degree in breastmilk.      ____________________________________________   FINAL CLINICAL IMPRESSION(S) / ED DIAGNOSES    Final diagnoses:  Viral gastroenteritis  Diarrhea of presumed infectious origin     ED Discharge Orders         Ordered    loperamide (IMODIUM A-D) 2 MG tablet  4 times daily PRN     12/14/18 0314    ondansetron (ZOFRAN ODT) 4 MG disintegrating  tablet  Every 8 hours PRN     12/14/18 0314    famotidine (PEPCID) 20 MG tablet  2 times daily     12/14/18 0314    ketorolac (TORADOL) 10 MG tablet  Every 6 hours PRN     12/14/18 0314          Portions of this note were generated with dragon dictation software. Dictation errors may occur despite best attempts at proofreading.   Sharman CheekStafford, Yoshiaki Kreuser, MD 12/14/18 [phone]

## 2018-12-14 NOTE — ED Triage Notes (Signed)
Pt reports pain to center of abd that radiates through to her back; started Saturday after; having nausea and diarrhea; last episode was about 1 hour pta;  temp 100.9 at home Sunday afternoon; denies urinary s/s; pt ambulatory with steady gait;

## 2018-12-22 ENCOUNTER — Emergency Department
Admission: EM | Admit: 2018-12-22 | Discharge: 2018-12-22 | Disposition: A | Discharge status: Home / Self Care | Payer: Managed Care, Other (non HMO) | Attending: Emergency Medicine | Admitting: Emergency Medicine

## 2018-12-22 ENCOUNTER — Encounter: Payer: Self-pay | Admitting: Emergency Medicine

## 2018-12-22 ENCOUNTER — Other Ambulatory Visit: Payer: Self-pay

## 2018-12-22 ENCOUNTER — Other Ambulatory Visit: Payer: Self-pay | Admitting: Psychiatry

## 2018-12-22 DIAGNOSIS — F332 Major depressive disorder, recurrent severe without psychotic features: Secondary | ICD-10-CM | POA: Insufficient documentation

## 2018-12-22 DIAGNOSIS — F419 Anxiety disorder, unspecified: Secondary | ICD-10-CM

## 2018-12-22 DIAGNOSIS — Z79899 Other long term (current) drug therapy: Secondary | ICD-10-CM | POA: Diagnosis not present

## 2018-12-22 DIAGNOSIS — F329 Major depressive disorder, single episode, unspecified: Secondary | ICD-10-CM | POA: Diagnosis present

## 2018-12-22 DIAGNOSIS — F32A Depression, unspecified: Secondary | ICD-10-CM

## 2018-12-22 DIAGNOSIS — J45909 Unspecified asthma, uncomplicated: Secondary | ICD-10-CM | POA: Insufficient documentation

## 2018-12-22 DIAGNOSIS — F331 Major depressive disorder, recurrent, moderate: Secondary | ICD-10-CM | POA: Diagnosis not present

## 2018-12-22 DIAGNOSIS — F322 Major depressive disorder, single episode, severe without psychotic features: Secondary | ICD-10-CM

## 2018-12-22 LAB — ACETAMINOPHEN LEVEL: Acetaminophen (Tylenol), Serum: <10 ug/mL — ABNORMAL LOW (ref 10–30)

## 2018-12-22 LAB — COMPREHENSIVE METABOLIC PANEL
ALT: 16 U/L (ref 0–44)
AST: 18 U/L (ref 15–41)
Albumin: 4.3 g/dL (ref 3.5–5.0)
Alkaline Phosphatase: 67 U/L (ref 38–126)
Anion gap: 6 (ref 5–15)
BILIRUBIN TOTAL: 0.7 mg/dL (ref 0.3–1.2)
BUN: 8 mg/dL (ref 6–20)
CO2: 27 mmol/L (ref 22–32)
Calcium: 9.1 mg/dL (ref 8.9–10.3)
Chloride: 106 mmol/L (ref 98–111)
Creatinine, Ser: 0.72 mg/dL (ref 0.44–1.00)
GFR calc Af Amer: >60 mL/min (ref >60)
GFR calc non Af Amer: >60 mL/min (ref >60)
Glucose, Bld: 183 mg/dL — ABNORMAL HIGH (ref 70–99)
Potassium: 3.1 mmol/L — ABNORMAL LOW (ref 3.5–5.1)
Sodium: 139 mmol/L (ref 135–145)
Total Protein: 8.3 g/dL — ABNORMAL HIGH (ref 6.5–8.1)

## 2018-12-22 LAB — CBC
HEMATOCRIT: 38 % (ref 36.0–46.0)
Hemoglobin: 12 g/dL (ref 12.0–15.0)
MCH: 26.5 pg (ref 26.0–34.0)
MCHC: 31.6 g/dL (ref 30.0–36.0)
MCV: 84.1 fL (ref 80.0–100.0)
Platelets: 429 10*3/uL — ABNORMAL HIGH (ref 150–400)
RBC: 4.52 MIL/uL (ref 3.87–5.11)
RDW: 13.8 % (ref 11.5–15.5)
WBC: 12.9 10*3/uL — ABNORMAL HIGH (ref 4.0–10.5)
nRBC: 0 % (ref 0.0–0.2)

## 2018-12-22 LAB — URINE DRUG SCREEN, QUALITATIVE (ARMC ONLY)
Amphetamines, Ur Screen: NOT DETECTED
Barbiturates, Ur Screen: NOT DETECTED
Benzodiazepine, Ur Scrn: NOT DETECTED
COCAINE METABOLITE, UR ~~LOC~~: NOT DETECTED
Cannabinoid 50 Ng, Ur ~~LOC~~: NOT DETECTED
MDMA (Ecstasy)Ur Screen: NOT DETECTED
METHADONE SCREEN, URINE: NOT DETECTED
OPIATE, UR SCREEN: NOT DETECTED
Phencyclidine (PCP) Ur S: NOT DETECTED
Tricyclic, Ur Screen: NOT DETECTED

## 2018-12-22 LAB — SALICYLATE LEVEL: Salicylate Lvl: <7 mg/dL (ref 2.8–30.0)

## 2018-12-22 LAB — ETHANOL: Alcohol, Ethyl (B): <10 mg/dL (ref <10)

## 2018-12-22 LAB — POCT PREGNANCY, URINE: Preg Test, Ur: NEGATIVE

## 2018-12-22 MED ORDER — SERTRALINE HCL 100 MG PO TABS: 100.0000 mg | ORAL_TABLET | Freq: Every day | ORAL | 1 refills | Status: DC

## 2018-12-22 MED ORDER — SERTRALINE HCL 50 MG PO TABS
100.0000 mg | ORAL_TABLET | Freq: Every day | ORAL | Status: DC
  Administered 2018-12-22: 100 mg via ORAL
  Filled 2018-12-22: qty 2

## 2018-12-22 NOTE — Consult Note (Signed)
[facility] Face-to-Face Psychiatry Consult   Reason for Consult: Anxiety and depression Referring Physician:  Dr. Alfred Levins Patient Identification: Angela Morton MRN:  [id number] Principal Diagnosis: Depression Diagnosis:  Principal Problem:   Depression Active Problems:   Anxiety  Subjective: "I was hoping the Prozac would work like it had 10 years ago, but it is not.  I feel worse.  I am having more anxiety and now feeling very depressed."  Total Time spent with patient: 1 hour  Per intake and emergency department: HPI:  Angela Morton is a 33 y.o. female patient  with a history of anxiety, asthma, GERD who presents voluntarily for depression.  Patient reports that she has been struggling with depression and anxiety attacks for the last several months.  Her husband has been diagnosed with seizure, she has 3 small children including 1 who is nonverbal with autism and now with aggressive behavior.  She reports that she feels there is a lot on her plate right now.  She saw her doctor last week and was started on Prozac.  She has been on Prozac 5 years ago for a month when she was having a lot of anxiety around not being able to get pregnant.  She reports that since being started on Prozac she has been feeling worse.  She has not been able to sleep, she has had severe nausea.  She has lost 10 pounds.  A couple of days ago she was giving her daughter a bath when the baby slipped from her hands in the tub.  Patient reports that she panicked because she felt that her daughter could have drowned and it would have been her fault.  She denies having any active thoughts of drowning her daughter.  She was concerned that she was so depressed that she was unfit to care for her daughter.  Today she went back to her primary care doctor and she was asked if she had a plan to kill herself. Patient said that she would swallow a razor blade.  Patient tells me that she is not sure why she said that.  She reports that she does  not feel suicidal but she does think about other people hurting themselves or her kids being harmed by her unintentionally just because she is not well. She denies any homicidal thoughts towards her children or anyone else.  She denies any prior suicide attempts.   On evaluation, patient is calm and cooperative.  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  She notes that her 68-year-old son and her 74-year-old son have both started school this year and she has time alone with HER-13-year-old daughter.  She does note that she has had some depression with each pregnancy postpartum.  She reports that she first started it medicine for anxiety at age 7 years when she was trying to get pregnant and had several miscarriages.  She took Prozac 10 mg for approximately 1 month and felt much calmer, and was able to conceive.  She has not been on any antidepressants since that time.  Patient reports symptoms as above.  She specifically denies any suicidal ideation, intent or plan.  She denies any HI or AVH.  Patient reports that her stress was further worsened when her husband had a seizure and fell on her while she was driving in October 5784.  Her children were in her car at that time.  Her 40-year-old son also required  surgery. Patient reports that she went to [facility] last Thursday and requested to be restarted on Prozac 10 mg.  She reports since that time she has had increased nausea and loss of appetite.  She states that she has lost 10 pounds in the past week.  She also notes that since restarting Prozac she has only been able to sleep every other night.  She states that she has mother and father who live within 5 miles from her.  Her children are currently being cared for by her husband and her parents.  She states that she and her husband have agreed that she and the children will stay at her parents home until she is feeling more stable.  Patient is  hopeful to start outpatient psychiatric treatment as well as therapy.  Past Psychiatric History: Anxiety  Risk to Self:  Denies Risk to Others:   Prior Inpatient Therapy:  None Prior Outpatient Therapy:  None, previously received medication from primary care provider  Past Medical History:  Past Medical History:  Diagnosis Date  . Asthma   . GERD (gastroesophageal reflux disease)   . Headache     Past Surgical History:  Procedure Laterality Date  . CESAREAN SECTION N/A 04/14/2015   Procedure: CESAREAN SECTION;  Surgeon: Honor Loh Ward, MD;  Location: [facility] ORS;  Service: Obstetrics;  Laterality: N/A;  . CESAREAN SECTION N/A 12/18/2016   Procedure: CESAREAN SECTION;  Surgeon: Will Bonnet, MD;  Location: [facility] ORS;  Service: Obstetrics;  Laterality: N/A;  . KNEE SURGERY    . knees     bil arthroscopy   Family History:  Family History  Problem Relation Age of Onset  . Colon polyps Father   . Hypertension Father   . Diabetes Father   . Heart disease Paternal Grandmother   . Rectal cancer Other    Family Psychiatric  History: Patient has mother and 3 sisters with anxiety; one sister has bipolar disorder and had made a suicide attempt in the past.  Social History:  Social History   Substance and Sexual Activity  Alcohol Use No     Social History   Substance and Sexual Activity  Drug Use No    Social History   Socioeconomic History  . Marital status: Married    Spouse name: Not on file  . Number of children: Not on file  . Years of education: Not on file  . Highest education level: Not on file  Occupational History  . Not on file  Social Needs  . Financial resource strain: Not on file  . Food insecurity:    Worry: Not on file    Inability: Not on file  . Transportation needs:    Medical: Not on file    Non-medical: Not on file  Tobacco Use  . Smoking status: Never Smoker  . Smokeless tobacco: Never Used  Substance and Sexual Activity  . Alcohol use: No   . Drug use: No  . Sexual activity: Yes    Comment: pregnant  Lifestyle  . Physical activity:    Days per week: Not on file    Minutes per session: Not on file  . Stress: Not on file  Relationships  . Social connections:    Talks on phone: Not on file    Gets together: Not on file    Attends religious service: Not on file    Active member of club or organization: Not on file    Attends meetings of clubs  or organizations: Not on file    Relationship status: Not on file  Other Topics Concern  . Not on file  Social History Narrative  . Not on file   Additional Social History: Patient is currently a stay-at-home mother.  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 Patient has parents who live within 5 miles who are supportive. Patient denies alcohol and substance use. Patient is currently nursing her 16-year-old daughter.    Allergies:   Allergies  Allergen Reactions  . Amoxicillin Shortness Of Breath and Other (See Comments)    Reaction:  Asthma attack  . Penicillins Shortness Of Breath and Other (See Comments)    Reaction:  Asthma attack  . Prednisone     Labs:  Results for orders placed or performed during the hospital encounter of 12/22/18 (from the past 48 hour(s))  Comprehensive metabolic panel     Status: Abnormal   Collection Time: 12/22/18  1:35 PM  Result Value Ref Range   Sodium 139 135 - 145 mmol/L   Potassium 3.1 (L) 3.5 - 5.1 mmol/L   Chloride 106 98 - 111 mmol/L   CO2 27 22 - 32 mmol/L   Glucose, Bld 183 (H) 70 - 99 mg/dL   BUN 8 6 - 20 mg/dL   Creatinine, Ser 0.72 0.44 - 1.00 mg/dL   Calcium 9.1 8.9 - 10.3 mg/dL   Total Protein 8.3 (H) 6.5 - 8.1 g/dL   Albumin 4.3 3.5 - 5.0 g/dL   AST 18 15 - 41 U/L   ALT 16 0 - 44 U/L   Alkaline Phosphatase 67 38 - 126 U/L   Total Bilirubin 0.7 0.3 - 1.2 mg/dL   GFR calc non Af Amer >60 >60 mL/min   GFR  calc Af Amer >60 >60 mL/min   Anion gap 6 5 - 15    Comment: Performed at [facility], [address], [place], [place] [address]  Ethanol     Status: None   Collection Time: 12/22/18  1:35 PM  Result Value Ref Range   Alcohol, Ethyl (B) <10 <10 mg/dL    Comment: (NOTE) Lowest detectable limit for serum alcohol is 10 mg/dL. For medical purposes only. Performed at [facility], [address] [place], [place] [address]   Salicylate level     Status: None   Collection Time: 12/22/18  1:35 PM  Result Value Ref Range   Salicylate Lvl <6.0 2.8 - 30.0 mg/dL    Comment: Performed at [facility], [address] [place], [place] [address]  Acetaminophen level     Status: Abnormal   Collection Time: 12/22/18  1:35 PM  Result Value Ref Range   Acetaminophen (Tylenol), Serum <10 (L) 10 - 30 ug/mL    Comment: (NOTE) Therapeutic concentrations vary significantly. A range of 10-30 ug/mL  may be an effective concentration for many patients. However, some  are best treated at concentrations outside of this range. Acetaminophen concentrations >150 ug/mL at 4 hours after ingestion  and >50 ug/mL at 12 hours after ingestion are often associated with  toxic reactions. Performed at [facility], [address] [place], [place] [address]   cbc     Status: Abnormal   Collection Time: 12/22/18  1:35 PM  Result Value Ref Range   WBC 12.9 (H) 4.0 - 10.5 K/uL   RBC 4.52 3.87 - 5.11 MIL/uL   Hemoglobin  12.0 12.0 - 15.0 g/dL   HCT 38.0 36.0 - 46.0 %   MCV 84.1 80.0 - 100.0 fL   MCH 26.5 26.0 - 34.0 pg   MCHC 31.6 30.0 - 36.0 g/dL   RDW 13.8 11.5 - 15.5 %   Platelets 429 (H) 150 - 400 K/uL   nRBC 0.0 0.0 - 0.2 %    Comment: Performed at [facility], [address] [place], [place] [address]  Urine Drug Screen, Qualitative     Status: None   Collection Time: 12/22/18  1:35 PM  Result Value Ref Range   Tricyclic, Ur Screen NONE DETECTED NONE  DETECTED   Amphetamines, Ur Screen NONE DETECTED NONE DETECTED   MDMA (Ecstasy)Ur Screen NONE DETECTED NONE DETECTED   Cocaine Metabolite,Ur [place] NONE DETECTED NONE DETECTED   Opiate, Ur Screen NONE DETECTED NONE DETECTED   Phencyclidine (PCP) Ur S NONE DETECTED NONE DETECTED   Cannabinoid 50 Ng, Ur [place] NONE DETECTED NONE DETECTED   Barbiturates, Ur Screen NONE DETECTED NONE DETECTED   Benzodiazepine, Ur Scrn NONE DETECTED NONE DETECTED   Methadone Scn, Ur NONE DETECTED NONE DETECTED    Comment: (NOTE) Tricyclics + metabolites, urine    Cutoff 1000 ng/mL Amphetamines + metabolites, urine  Cutoff 1000 ng/mL MDMA (Ecstasy), urine              Cutoff 500 ng/mL Cocaine Metabolite, urine          Cutoff 300 ng/mL Opiate + metabolites, urine        Cutoff 300 ng/mL Phencyclidine (PCP), urine         Cutoff 25 ng/mL Cannabinoid, urine                 Cutoff 50 ng/mL Barbiturates + metabolites, urine  Cutoff 200 ng/mL Benzodiazepine, urine              Cutoff 200 ng/mL Methadone, urine                   Cutoff 300 ng/mL The urine drug screen provides only a preliminary, unconfirmed analytical test result and should not be used for non-medical purposes. Clinical consideration and professional judgment should be applied to any positive drug screen result due to possible interfering substances. A more specific alternate chemical method must be used in order to obtain a confirmed analytical result. Gas chromatography / mass spectrometry (GC/MS) is the preferred confirmat ory method. Performed at [facility], [address] [place], [place] [address]   Pregnancy, urine POC     Status: None   Collection Time: 12/22/18  1:51 PM  Result Value Ref Range   Preg Test, Ur NEGATIVE NEGATIVE    Comment:        THE SENSITIVITY OF THIS METHODOLOGY IS >24 mIU/mL     Current Facility-Administered Medications  Medication Dose Route Frequency Provider Last Rate Last Dose  . sertraline  (ZOLOFT) tablet 100 mg  100 mg Oral Daily Lavella Hammock, MD       Current Outpatient Medications  Medication Sig Dispense Refill  . albuterol (PROVENTIL HFA;VENTOLIN HFA) 108 (90 BASE) MCG/ACT inhaler Inhale 2 puffs into the lungs every 6 (six) hours as needed for wheezing or shortness of breath.    . famotidine (PEPCID) 20 MG tablet Take 1 tablet (20 mg total) by mouth 2 (two) times daily. 60 tablet 0  . ketorolac (TORADOL) 10 MG tablet Take 1 tablet (10 mg total) by mouth every 6 (six)  hours as needed for moderate pain. 12 tablet 0  . loperamide (IMODIUM A-D) 2 MG tablet Take 2 tablets (4 mg total) by mouth 4 (four) times daily as needed for diarrhea or loose stools. 30 tablet 0  . omeprazole (PRILOSEC) 20 MG capsule Take 20 mg by mouth daily.    . ondansetron (ZOFRAN ODT) 4 MG disintegrating tablet Take 1 tablet (4 mg total) by mouth every 8 (eight) hours as needed for nausea or vomiting. 20 tablet 0  . sertraline (ZOLOFT) 100 MG tablet Take 1 tablet (100 mg total) by mouth daily. 30 tablet 1    Musculoskeletal: Strength & Muscle Tone: within normal limits Gait & Station: normal Patient leans: N/A  Psychiatric Specialty Exam: Physical Exam  Nursing note and vitals reviewed. Constitutional: She is oriented to person, place, and time. She appears well-developed and well-nourished. No distress.  HENT:  Head: Normocephalic and atraumatic.  Eyes: EOM are normal.  Neck: Normal range of motion.  Cardiovascular: Normal rate.  Respiratory: Effort normal.  Musculoskeletal: Normal range of motion.  Neurological: She is alert and oriented to person, place, and time.  Skin: Skin is warm and dry.    Review of Systems  Constitutional: Negative.   HENT: Negative.   Respiratory: Negative.   Cardiovascular: Negative.   Gastrointestinal: Positive for nausea. Negative for vomiting.  Musculoskeletal: Negative.   Neurological: Negative.   Psychiatric/Behavioral: Positive for depression and  memory loss. Negative for hallucinations, substance abuse and suicidal ideas. The patient is nervous/anxious. The patient does not have insomnia.     Blood pressure (!) 163/94, temperature 98.6 F (37 C), temperature source Oral, resp. rate 18, height _0  (1.676 m), weight 102.1 kg, last menstrual period 12/22/2018, SpO2 100 %, unknown if currently breastfeeding.Body mass index is 36.32 kg/m.  General Appearance: Casual  Eye Contact:  Good  Speech:  Clear and Coherent and Normal Rate  Volume:  Normal  Mood:  Anxious and Dysphoric  Affect:  Congruent  Thought Process:  Coherent, Goal Directed, Linear and Descriptions of Associations: Intact  Orientation:  Full (Time, Place, and Person)  Thought Content:  Logical and Hallucinations: None  Suicidal Thoughts:  No  Homicidal Thoughts:  No  Memory:  good  Judgement:  Good  Insight:  Good  Psychomotor Activity:  Normal  Concentration:  Attention Span: Good  Recall:  Good  Fund of Knowledge:  Good  Language:  Good  Akathisia:  No  Handed:  Right  AIMS (if indicated):   n/a  Assets:  Communication Skills Desire for Improvement Financial Resources/Insurance [place] Talents/Skills Transportation Vocational/Educational  ADL's:  Intact  Cognition:  WNL  Sleep:   adequate     Treatment Plan Summary: Medication management and Start Zoloft 100 mg daily, first dose to be given in the emergency department.  Patient has been provided with written safety information for Zoloft and lactation.  Disposition: No evidence of imminent risk to self or others at present.   Patient does not meet criteria for psychiatric inpatient admission. Supportive therapy provided about ongoing stressors. Discussed crisis plan, support from social network, calling 911, coming to the Emergency Department, and calling Suicide Hotline. Encourage patient to continue to receive support from family members until she feels  secure.  I have asked her to schedule a therapy appointment within the next week and provided her with access to psychology today website find a therapist.  I have provided her with resources for outpatient psychiatrists.  Prescription was written for 30 days with 1 refill to allow her time to get an outpatient psychiatry appointment.  Patient can follow-up with PCP as needed, or return to the emergency department.   She was able to engage in safety planning including plan to return to nearest emergency department or contact emergency services if she feels unable to maintain her own safety or the safety of others. Patient had no further questions, comments, or concerns.  Discharge into care of husband, who agrees to maintain patient safety.    Lavella Hammock, MD 12/22/2018 6:35 PM

## 2018-12-22 NOTE — ED Notes (Signed)
Pt to be discharged home. Given the phone to call her husband for a ride. Maintained on 15 minute checks and observation by security camera for safety.

## 2018-12-22 NOTE — ED Notes (Signed)
Pt wanted me to check her temperature she was feeling hot. T- 98.0

## 2018-12-22 NOTE — ED Notes (Signed)
Pt changed into burgundy scrubs by this Patent examiner. Pt's belongings given to husband per pt request.

## 2018-12-22 NOTE — Discharge Instructions (Addendum)
Please seek medical attention and help for any thoughts about wanting to harm yourself, harm others, any concerning change in behavior, severe depression, inappropriate drug use or any other new or concerning symptoms. ° °

## 2018-12-22 NOTE — ED Notes (Signed)
VOL/ Consult ordered  

## 2018-12-22 NOTE — ED Notes (Signed)
Patient resting quietly in room. No noted distress or abnormal behaviors noted. Will continue 15 minute checks and observation by security camera for safety. 

## 2018-12-22 NOTE — ED Triage Notes (Signed)
Pt arrived with husband with concerns over anxiety/depression and suicidal ideation for the last week. Pt denies a plan and has developed a safety contract with family to not be alone at any time. Pt expressed her concerns to her PCP today who suggested pt come to ED for evaluation.

## 2018-12-22 NOTE — ED Provider Notes (Signed)
Psychiatry has evaluated the patient.  They felt that patient was safe for discharge home.  They did prescribe patient medication.   Phineas Semen, MD 12/22/18 [phone]

## 2018-12-22 NOTE — ED Notes (Signed)
Per pt husband Krystina Anuszewski, and parents Royal and Dorena Cookey have permission to call and receive information  PASSWORD ESTABLISHED: [phone]

## 2018-12-22 NOTE — Progress Notes (Signed)
Provided with Zoloft 100 mg QD #30 with 1 refill while patient awaits outpatient psychiatrist.  See ED note with same date.  SM

## 2018-12-22 NOTE — ED Provider Notes (Signed)
[facility] Emergency Department Provider Note  ____________________________________________  Time seen: Approximately 2:04 PM  I have reviewed the triage vital signs and the nursing notes.   HISTORY  Chief Complaint Suicidal   HPI Angela Morton is a 33 y.o. female with a history of anxiety, asthma, GERD who presents voluntarily for depression.  Patient reports that she has been struggling with depression and anxiety attacks for the last several months.  Her husband has been diagnosed with seizure, she has 3 small children including 1 who is nonverbal with autism and now with aggressive behavior.  She reports that she feels there is a lot on her plate right now.  She saw her doctor last week and was started on Prozac.  She has been on Prozac 5 years ago for a month when she was having a lot of anxiety around not being able to get pregnant.  She reports that since being started on Prozac she has been feeling worse.  She has not been able to sleep, she has had severe nausea.  She has lost 10 pounds.  A couple of days ago she was giving her daughter a bath when the baby slipped from her hands in the tub.  Patient reports that she panicked because she felt that her daughter could have drowned and it would have been her fault.  She denies having any active thoughts of drowning her daughter.  She was concerned that she was so depressed that she was unfit to care for her daughter.  Today she went back to her primary care doctor and she was asked if she had a plan to kill herself. Patient said that she would swallow a razor blade.  Patient tells me that she is not sure why she said that.  She reports that she does not feel suicidal but she does think about other people hurting themselves or her kids being harmed by her unintentionally just because she is not well. She denies any homicidal thoughts towards her children or anyone else.  She denies any prior suicide attempts.     Past Medical History:  Diagnosis Date  . Asthma   . GERD (gastroesophageal reflux disease)   . Headache     Patient Active Problem List   Diagnosis Date Noted  . Preeclampsia, severe, third trimester 12/18/2016  . Delivery by elective cesarean section 04/14/2015  . Cesarean delivery delivered 04/14/2015    Past Surgical History:  Procedure Laterality Date  . CESAREAN SECTION N/A 04/14/2015   Procedure: CESAREAN SECTION;  Surgeon: Elenora Fender Ward, MD;  Location: [facility] ORS;  Service: Obstetrics;  Laterality: N/A;  . CESAREAN SECTION N/A 12/18/2016   Procedure: CESAREAN SECTION;  Surgeon: Conard Novak, MD;  Location: [facility] ORS;  Service: Obstetrics;  Laterality: N/A;  . KNEE SURGERY    . knees     bil arthroscopy    Prior to Admission medications   Medication Sig Start Date End Date Taking? Authorizing Provider  albuterol (PROVENTIL HFA;VENTOLIN HFA) 108 (90 BASE) MCG/ACT inhaler Inhale 2 puffs into the lungs every 6 (six) hours as needed for wheezing or shortness of breath.    [provider]  famotidine (PEPCID) 20 MG tablet Take 1 tablet (20 mg total) by mouth 2 (two) times daily. 12/14/18   Sharman Cheek, MD  ketorolac (TORADOL) 10 MG tablet Take 1 tablet (10 mg total) by mouth every 6 (six) hours as needed for moderate pain. 12/14/18   Sharman Cheek, MD  loperamide (  IMODIUM A-D) 2 MG tablet Take 2 tablets (4 mg total) by mouth 4 (four) times daily as needed for diarrhea or loose stools. 12/14/18   Sharman CheekStafford, Phillip, MD  omeprazole (PRILOSEC) 20 MG capsule Take 20 mg by mouth daily.    [provider]  ondansetron (ZOFRAN ODT) 4 MG disintegrating tablet Take 1 tablet (4 mg total) by mouth every 8 (eight) hours as needed for nausea or vomiting. 12/14/18   Sharman CheekStafford, Phillip, MD    Allergies Amoxicillin; Penicillins; and Prednisone  Family History  Problem Relation Age of Onset  . Colon polyps Father   . Hypertension Father   . Diabetes Father   .  Heart disease Paternal Grandmother   . Rectal cancer Other     Social History Social History   Tobacco Use  . Smoking status: Never Smoker  . Smokeless tobacco: Never Used  Substance Use Topics  . Alcohol use: No  . Drug use: No    Review of Systems  Constitutional: Negative for fever. Eyes: Negative for visual changes. ENT: Negative for sore throat. Neck: No neck pain  Cardiovascular: Negative for chest pain. Respiratory: Negative for shortness of breath. Gastrointestinal: Negative for abdominal pain, vomiting or diarrhea. Genitourinary: Negative for dysuria. Musculoskeletal: Negative for back pain. Skin: Negative for rash. Neurological: Negative for headaches, weakness or numbness. Psych: No SI or HI. + depression  ____________________________________________   PHYSICAL EXAM:  VITAL SIGNS: ED Triage Vitals  Enc Vitals Group     BP 12/22/18 1331 (!) 163/94     Pulse --      Resp 12/22/18 1331 18     Temp 12/22/18 1331 98.6 F (37 C)     Temp Source 12/22/18 1331 Oral     SpO2 12/22/18 1331 100 %     Weight 12/22/18 1333 225 lb (102.1 kg)     Height 12/22/18 1333 5\' 6"  (1.676 m)     Head Circumference --      Peak Flow --      Pain Score 12/22/18 1333 0     Pain Loc --      Pain Edu? --      Excl. in GC? --     Constitutional: Alert and oriented, teary, sad.  HEENT:      Head: Normocephalic and atraumatic.         Eyes: Conjunctivae are normal. Sclera is non-icteric.       Mouth/Throat: Mucous membranes are moist.       Neck: Supple with no signs of meningismus. Cardiovascular: Regular rate and rhythm. No murmurs, gallops, or rubs. 2+ symmetrical distal pulses are present in all extremities. No JVD. Respiratory: Normal respiratory effort. Lungs are clear to auscultation bilaterally. No wheezes, crackles, or rhonchi.  Gastrointestinal: Soft, non tender, and non distended with positive bowel sounds. No rebound or guarding. Musculoskeletal: Nontender with  normal range of motion in all extremities. No edema, cyanosis, or erythema of extremities. Neurologic: Normal speech and language. Face is symmetric. Moving all extremities. No gross focal neurologic deficits are appreciated. Skin: Skin is warm, dry and intact. No rash noted. Psychiatric: Mood and affect are depressed. Speech and behavior are normal.  ____________________________________________   LABS (all labs ordered are listed, but only abnormal results are displayed)  Labs Reviewed  COMPREHENSIVE METABOLIC PANEL - Abnormal; Notable for the following components:      Result Value   Potassium 3.1 (*)    Glucose, Bld 183 (*)    Total Protein 8.3 (*)  All other components within normal limits  ACETAMINOPHEN LEVEL - Abnormal; Notable for the following components:   Acetaminophen (Tylenol), Serum <10 (*)    All other components within normal limits  CBC - Abnormal; Notable for the following components:   WBC 12.9 (*)    Platelets 429 (*)    All other components within normal limits  ETHANOL  SALICYLATE LEVEL  URINE DRUG SCREEN, QUALITATIVE ([facility] ONLY)  POC URINE PREG, ED  POCT PREGNANCY, URINE   ____________________________________________  EKG  none  ____________________________________________  RADIOLOGY  none  ____________________________________________   PROCEDURES  Procedure(s) performed: None Procedures Critical Care performed:  None ____________________________________________   INITIAL IMPRESSION / ASSESSMENT AND PLAN / ED COURSE  33 y.o. female with a history of anxiety, asthma, GERD who presents voluntarily for depression. She denies any active suicidal or homicidal thoughts however tells me she worries about her kids getting harmed around her since she has been so depressed. 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 She is here voluntarily asking for help. Will consult psychiatry for  evaluation      As part of my medical decision making, I reviewed the following data within the electronic MEDICAL RECORD NUMBER Nursing notes reviewed and incorporated, Labs reviewed , A consult was requested and obtained from this/these consultant(s) Psychiatry, Notes from prior ED visits and [place] Controlled Substance Database    Pertinent labs & imaging results that were available during my care of the patient were reviewed by me and considered in my medical decision making (see chart for details).    ____________________________________________   FINAL CLINICAL IMPRESSION(S) / ED DIAGNOSES  Final diagnoses:  Current severe episode of major depressive disorder without psychotic features, unspecified whether recurrent (HCC)      NEW MEDICATIONS STARTED DURING THIS VISIT:  ED Discharge Orders    None       Note:  This document was prepared using Dragon voice recognition software and may include unintentional dictation errors.    Don PerkingVeronese, [place], MD 12/22/18 [id number]

## 2018-12-22 NOTE — ED Notes (Signed)
Food tray was given with juice. 

## 2019-02-01 ENCOUNTER — Other Ambulatory Visit: Payer: Self-pay

## 2019-02-01 ENCOUNTER — Encounter: Payer: Self-pay | Admitting: Psychiatry

## 2019-02-01 ENCOUNTER — Ambulatory Visit: Payer: 59 | Admitting: Psychiatry

## 2019-02-01 VITALS — BP 129/88 | HR 90 | Temp 98.2°F | Wt 222.2 lb

## 2019-02-01 DIAGNOSIS — N926 Irregular menstruation, unspecified: Secondary | ICD-10-CM | POA: Insufficient documentation

## 2019-02-01 DIAGNOSIS — F411 Generalized anxiety disorder: Secondary | ICD-10-CM | POA: Diagnosis not present

## 2019-02-01 DIAGNOSIS — G4700 Insomnia, unspecified: Secondary | ICD-10-CM

## 2019-02-01 DIAGNOSIS — IMO0002 Reserved for concepts with insufficient information to code with codable children: Secondary | ICD-10-CM | POA: Insufficient documentation

## 2019-02-01 MED ORDER — SERTRALINE HCL 100 MG PO TABS: 100.0000 mg | ORAL_TABLET | Freq: Every day | ORAL | 0 refills | Status: DC
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
## 2019-02-02 ENCOUNTER — Encounter: Payer: Self-pay | Admitting: Psychiatry

## 2019-03-03 ENCOUNTER — Other Ambulatory Visit: Payer: Self-pay

## 2019-03-03 ENCOUNTER — Ambulatory Visit (INDEPENDENT_AMBULATORY_CARE_PROVIDER_SITE_OTHER): Payer: 59 | Admitting: Psychiatry

## 2019-03-03 ENCOUNTER — Encounter: Payer: Self-pay | Admitting: Psychiatry

## 2019-03-03 DIAGNOSIS — F411 Generalized anxiety disorder: Secondary | ICD-10-CM

## 2019-03-03 DIAGNOSIS — F5105 Insomnia due to other mental disorder: Secondary | ICD-10-CM

## 2019-03-03 NOTE — Progress Notes (Signed)
Virtual Visit via Telephone Note  I connected with Angela Morton on 03/03/19 at 11:15 AM EDT by telephone and verified that I am speaking with the correct person using two identifiers.   I discussed the limitations, risks, security and privacy concerns of performing an evaluation and management service by telephone and the availability of in person appointments. I also discussed with the patient that there may be a patient responsible charge related to this service. The patient expressed understanding and agreed to proceed.    I discussed the assessment and treatment plan with the patient. The patient was provided an opportunity to ask questions and all were answered. The patient agreed with the plan and demonstrated an understanding of the instructions.   The patient was advised to call back or seek an in-person evaluation if the symptoms worsen or if the condition fails to improve as anticipated.  [facility] MD OP Progress Note  03/03/2019 5:16 PM [name]  MRN:  [id number]  Chief Complaint:  Chief Complaint    Follow-up     HPI: Angela Morton is a 33 yr old CF,married , lives in [place] , has a history of GAD, insomnia, GERD , Asthma , was evaluated by phone today.  Patient today reports that she is doing OK with regards to her anxiety symptoms. Her husband is currently seizure free since the past 6 months and has been cleared to start driving and do other activities and that is a tremendous relief for her. Patient reports he is also currently able to work from home due to the Covid 19 outbreak. That also helps her to feel less stressful and anxious.  She reports she is compliant with Zoloft. She denies any side effects to her medications.  She is sleeping better. She is working on her sleep hygiene and her children also let her sleep through the night now and that has helped a lot.  She continues to follow up with her therapist- Ms.Lester Oxnard . Therapy sessions are beneficial.  She  denies suicidality, homicidality or perceptual disturbances.  She denies any other concerns today.   Visit Diagnosis:    ICD-10-CM   1. GAD (generalized anxiety disorder) F41.1   2. Insomnia due to mental condition F51.05    IMPROVING    Past Psychiatric History: I have reviewed past psychiatric history from my progress notes on 02/01/2019  Past Medical History:  Past Medical History:  Diagnosis Date  . Anxiety   . Asthma   . Depression   . GERD (gastroesophageal reflux disease)   . Headache     Past Surgical History:  Procedure Laterality Date  . CESAREAN SECTION N/A 04/14/2015   Procedure: CESAREAN SECTION;  Surgeon: Elenora Fender Ward, MD;  Location: [facility] ORS;  Service: Obstetrics;  Laterality: N/A;  . CESAREAN SECTION N/A 12/18/2016   Procedure: CESAREAN SECTION;  Surgeon: Conard Novak, MD;  Location: [facility] ORS;  Service: Obstetrics;  Laterality: N/A;  . KNEE SURGERY    . knees     bil arthroscopy    Family Psychiatric History: Reviewed Family history from my progress notes on 02/01/2019  Family History:  Family History  Problem Relation Age of Onset  . Colon polyps Father   . Hypertension Father   . Diabetes Father   . Heart disease Paternal Grandmother   . Rectal cancer Other   . Anxiety disorder Mother   . Depression Mother   . Anxiety disorder Sister   . Bipolar disorder Sister   .  Depression Sister   . Anxiety disorder Sister   . Anxiety disorder Sister   . Anxiety disorder Sister     Social History: Reviewed Social history from my progress note on 02/01/2019 Social History   Socioeconomic History  . Marital status: Married    Spouse name: zachary  . Number of children: 3  . Years of education: Not on file  . Highest education level: High school graduate  Occupational History  . Not on file  Social Needs  . Financial resource strain: Not hard at all  . Food insecurity:    Worry: Never true    Inability: Never true  . Transportation needs:     Medical: No    Non-medical: No  Tobacco Use  . Smoking status: Never Smoker  . Smokeless tobacco: Never Used  Substance and Sexual Activity  . Alcohol use: No  . Drug use: No  . Sexual activity: Yes    Comment: pregnant  Lifestyle  . Physical activity:    Days per week: 3 days    Minutes per session: 60 min  . Stress: Rather much  Relationships  . Social connections:    Talks on phone: Not on file    Gets together: Not on file    Attends religious service: More than 4 times per year    Active member of club or organization: No    Attends meetings of clubs or organizations: Never    Relationship status: Married  Other Topics Concern  . Not on file  Social History Narrative  . Not on file    Allergies:  Allergies  Allergen Reactions  . Amoxicillin Shortness Of Breath and Other (See Comments)    Reaction:  Asthma attack  . Cephalosporins Other (See Comments) and Nausea And Vomiting    Other Reaction: Hives with Duricef  . Penicillins Shortness Of Breath, Other (See Comments) and Hives    Reaction:  Asthma attack Trouble breathing   . Prednisone     Metabolic Disorder Labs: No results found for: HGBA1C, MPG No results found for: PROLACTIN No results found for: CHOL, TRIG, HDL, CHOLHDL, VLDL, LDLCALC No results found for: TSH  Therapeutic Level Labs: No results found for: LITHIUM No results found for: VALPROATE No components found for:  CBMZ  Current Medications: Current Outpatient Medications  Medication Sig Dispense Refill  . omeprazole (PRILOSEC) 20 MG capsule Take 20 mg by mouth daily.    . sertraline (ZOLOFT) 100 MG tablet Take 1 tablet (100 mg total) by mouth daily. 90 tablet 0   No current facility-administered medications for this visit.      Musculoskeletal: Strength & Muscle Tone: UTA Gait & Station: UTA Patient leans: N/A  Psychiatric Specialty Exam: Review of Systems  Psychiatric/Behavioral: The patient is nervous/anxious.   All other  systems reviewed and are negative.   Last menstrual period 02/10/2019, unknown if currently breastfeeding.There is no height or weight on file to calculate BMI.  General Appearance: UTA  Eye Contact:  UTA  Speech:  Clear and Coherent  Volume:  Normal  Mood:  Anxious improving  Affect:  UTA  Thought Process:  Goal Directed and Descriptions of Associations: Intact  Orientation:  Full (Time, Place, and Person)  Thought Content: Logical   Suicidal Thoughts:  No  Homicidal Thoughts:  No  Memory:  Immediate;   Fair Recent;   Fair Remote;   Fair  Judgement:  Fair  Insight:  Fair  Psychomotor Activity:  UTA  Concentration:  Concentration: Fair and Attention Span: Fair  Recall:  [employer] of Knowledge: Fair  Language: Fair  Akathisia:  No  Handed:  Right  AIMS (if indicated): Denies tremors, rigidity,stiffness  Assets:  Communication Skills Desire for Improvement Social Support  ADL's:  Intact  Cognition: WNL  Sleep:  Fair   Screenings:   Assessment and Plan: Chanty is a 33 year old CF, married , stay at home mom, lives in a [place] , has a history of GAD, insomnia , asthma and GERD , was evaluated by phone today. Patient is biologically predisposed given her family history . She also has psychosocial stressors of her husband's health issues , son with autism and so on. She is currently making progress on medications as well as therapy. Continue plan as noted below.  Plan  GAD - improving Zoloft 100 mg Po daily . Continue CBT with Therapist Lester Thurston .  For Insomnia - improving She is working on sleep hygiene. Hydroxyzine as needed.  TSH Labs ordered - pending.  Follow up in clinic in 2 months or sooner if needed.  I have spent atleast 15 minutes non face to face with patient today. More than 50 % of the time was spent for psychoeducation and supportive psychotherapy and care coordination.   This note was generated in part or whole with voice recognition  software. Voice recognition is usually quite accurate but there are transcription errors that can and very often do occur. I apologize for any typographical errors that were not detected and corrected.       Jomarie Longs, MD 03/03/2019, 5:16 PM

## 2019-03-03 NOTE — Progress Notes (Signed)
TC  On 03-03-19 @ 10:10 pt medical and surgical hx section was reviewed with no updated. Pt allergies were reviewed with no changes. Pt medications and pharmacy were reviewed and no updated. No vitals taken due to this is a phone visit.

## 2019-04-16 ENCOUNTER — Telehealth: Payer: Self-pay

## 2019-04-16 NOTE — Telephone Encounter (Signed)
pt called states she is having hair loss wants to know if it is the medication that doing it.

## 2019-04-16 NOTE — Telephone Encounter (Signed)
Unable to say , however she could montor self closely and if it gets worse stop medication .please ask her to be seen for re eval.

## 2019-05-03 ENCOUNTER — Ambulatory Visit (INDEPENDENT_AMBULATORY_CARE_PROVIDER_SITE_OTHER): Payer: Self-pay | Admitting: Psychiatry

## 2019-05-03 ENCOUNTER — Other Ambulatory Visit: Payer: Self-pay

## 2019-05-03 ENCOUNTER — Encounter: Payer: Self-pay | Admitting: Psychiatry

## 2019-05-03 DIAGNOSIS — F5105 Insomnia due to other mental disorder: Secondary | ICD-10-CM | POA: Insufficient documentation

## 2019-05-03 DIAGNOSIS — F411 Generalized anxiety disorder: Secondary | ICD-10-CM

## 2019-05-03 MED ORDER — SERTRALINE HCL 50 MG PO TABS: 75.0000 mg | ORAL_TABLET | Freq: Every day | ORAL | 0 refills | Status: DC

## 2019-05-03 NOTE — Progress Notes (Signed)
Virtual Visit via Video Note  I connected with Angela Morton on 05/03/19 at 11:30 AM EDT by a video enabled telemedicine application and verified that I am speaking with the correct person using two identifiers.   I discussed the limitations of evaluation and management by telemedicine and the availability of in person appointments. The patient expressed understanding and agreed to proceed.     I discussed the assessment and treatment plan with the patient. The patient was provided an opportunity to ask questions and all were answered. The patient agreed with the plan and demonstrated an understanding of the instructions.   The patient was advised to call back or seek an in-person evaluation if the symptoms worsen or if the condition fails to improve as anticipated.   [facility] MD OP Progress Note  05/03/2019 12:52 PM Angela Morton  MRN:  [id number]  Chief Complaint:  Chief Complaint    Follow-up     HPI: Angela Morton is a 33 year old Caucasian female, married, lives in [place], has a history of GAD, insomnia, GERD, asthma was evaluated by telemedicine today.  Patient today reports she has noticed improvement in her mood symptoms.  She is able to better control her anxiety symptoms.  The Zoloft has been helpful.  She also continues to meet with her therapist Ms. Rhona Raider that has been very beneficial.  She reports she has noticed some hair loss and does not know if the Zoloft is contributing to it.  Discussed with patient it is not a very common side effect but still possible.  She is agreeable to reducing the dosage of Zoloft to see if that will help.  She will also reach out to her PMD to get routine labs like TSH done to rule out other causes.  Patient denies any suicidality, homicidality or perceptual disturbances.  She reports sleep is restless since her children needs help at night.  She denies any other concerns today. Visit Diagnosis:    ICD-10-CM   1. GAD (generalized anxiety  disorder)  F41.1 sertraline (ZOLOFT) 50 MG tablet  2. Insomnia due to mental condition  F51.05     Past Psychiatric History: I have reviewed past psychiatric history from my progress note on 02/01/2019.  Past Medical History:  Past Medical History:  Diagnosis Date  . Anxiety   . Asthma   . Depression   . GERD (gastroesophageal reflux disease)   . Headache     Past Surgical History:  Procedure Laterality Date  . CESAREAN SECTION N/A 04/14/2015   Procedure: CESAREAN SECTION;  Surgeon: Honor Loh Ward, MD;  Location: [facility] ORS;  Service: Obstetrics;  Laterality: N/A;  . CESAREAN SECTION N/A 12/18/2016   Procedure: CESAREAN SECTION;  Surgeon: Will Bonnet, MD;  Location: [facility] ORS;  Service: Obstetrics;  Laterality: N/A;  . KNEE SURGERY    . knees     bil arthroscopy    Family Psychiatric History: I have reviewed family psychiatric history from my progress note on 02/01/2019.  Family History:  Family History  Problem Relation Age of Onset  . Colon polyps Father   . Hypertension Father   . Diabetes Father   . Heart disease Paternal Grandmother   . Rectal cancer Other   . Anxiety disorder Mother   . Depression Mother   . Anxiety disorder Sister   . Bipolar disorder Sister   . Depression Sister   . Anxiety disorder Sister   . Anxiety disorder Sister   . Anxiety disorder Sister  Social History: Have reviewed social history from my progress note on 02/01/2019. Social History   Socioeconomic History  . Marital status: Married    Spouse name: zachary  . Number of children: 3  . Years of education: Not on file  . Highest education level: High school graduate  Occupational History  . Not on file  Social Needs  . Financial resource strain: Not hard at all  . Food insecurity    Worry: Never true    Inability: Never true  . Transportation needs    Medical: No    Non-medical: No  Tobacco Use  . Smoking status: Never Smoker  . Smokeless tobacco: Never Used  Substance  and Sexual Activity  . Alcohol use: No  . Drug use: No  . Sexual activity: Yes    Comment: pregnant  Lifestyle  . Physical activity    Days per week: 3 days    Minutes per session: 60 min  . Stress: Rather much  Relationships  . Social Musicianconnections    Talks on phone: Not on file    Gets together: Not on file    Attends religious service: More than 4 times per year    Active member of club or organization: No    Attends meetings of clubs or organizations: Never    Relationship status: Married  Other Topics Concern  . Not on file  Social History Narrative  . Not on file    Allergies:  Allergies  Allergen Reactions  . Amoxicillin Shortness Of Breath and Other (See Comments)    Reaction:  Asthma attack  . Cephalosporins Other (See Comments) and Nausea And Vomiting    Other Reaction: Hives with Duricef  . Penicillins Shortness Of Breath, Other (See Comments) and Hives    Reaction:  Asthma attack Trouble breathing   . Prednisone     Metabolic Disorder Labs: No results found for: HGBA1C, MPG No results found for: PROLACTIN No results found for: CHOL, TRIG, HDL, CHOLHDL, VLDL, LDLCALC No results found for: TSH  Therapeutic Level Labs: No results found for: LITHIUM No results found for: VALPROATE No components found for:  CBMZ  Current Medications: Current Outpatient Medications  Medication Sig Dispense Refill  . omeprazole (PRILOSEC) 20 MG capsule Take 20 mg by mouth daily.    . sertraline (ZOLOFT) 50 MG tablet Take 1.5 tablets (75 mg total) by mouth daily. 135 tablet 0   No current facility-administered medications for this visit.      Musculoskeletal: Strength & Muscle Tone: within normal limits Gait & Station: normal Patient leans: N/A  Psychiatric Specialty Exam: Review of Systems  Psychiatric/Behavioral: The patient is not nervous/anxious.   All other systems reviewed and are negative.   unknown if currently breastfeeding.There is no height or weight  on file to calculate BMI.  General Appearance: Casual  Eye Contact:  Fair  Speech:  Clear and Coherent  Volume:  Normal  Mood:  Euthymic  Affect:  Congruent  Thought Process:  Goal Directed and Descriptions of Associations: Intact  Orientation:  Full (Time, Place, and Person)  Thought Content: Logical   Suicidal Thoughts:  No  Homicidal Thoughts:  No  Memory:  Immediate;   Fair Recent;   Fair Remote;   Fair  Judgement:  Fair  Insight:  Fair  Psychomotor Activity:  Normal  Concentration:  Concentration: Fair and Attention Span: Fair  Recall:  [employer]  Fund of Knowledge: Fair  Language: Fair  Akathisia:  No  Handed:  Right  AIMS (if indicated): Denies, tremors, rigidity, stiffness  Assets:  Communication Skills Desire for Improvement Social Support  ADL's:  Intact  Cognition: WNL  Sleep:  Fair   Screenings:   Assessment and Plan: Angela Morton is a 33 year old patient female, married, stay-at-home mom, lives in [place], has a history of GAD, insomnia, asthma, GERD was evaluated by telemedicine today.  Patient is biologically predisposed given her family history.  She also has psychosocial stressors of her husband's health issues, son with autism.  She is currently making progress on the current medication regimen, is in psychotherapy sessions which are helpful as well as has good social support system.  Plan as noted below.  Plan GAD-improving Reduce Zoloft to 75 mg p.o. daily. Patient with improvement in her symptoms as well as possible side effect of Zoloft. Continue CBT with therapist Lester Carolinamanda Miller.  For insomnia- restless Patient reports her children needs help at night which makes her sleep restless. She will continue to work on sleep hygiene.  Discussed with patient to go to her primary medical doctor to get routine labs done like TSH.  Follow-up in clinic in 2 to 3 months or sooner if needed.  September 15 at 10:30 AM  I have spent atleast 15 minutes non face to face  with patient today. More than 50 % of the time was spent for psychoeducation and supportive psychotherapy and care coordination.  This note was generated in part or whole with voice recognition software. Voice recognition is usually quite accurate but there are transcription errors that can and very often do occur. I apologize for any typographical errors that were not detected and corrected.        Jomarie LongsSaramma Ottavio Norem, MD 05/03/2019, 12:52 PM

## 2019-08-03 ENCOUNTER — Other Ambulatory Visit: Payer: Self-pay

## 2019-08-03 ENCOUNTER — Encounter: Payer: Self-pay | Admitting: Psychiatry

## 2019-08-03 ENCOUNTER — Ambulatory Visit (INDEPENDENT_AMBULATORY_CARE_PROVIDER_SITE_OTHER): Payer: Self-pay | Admitting: Psychiatry

## 2019-08-03 DIAGNOSIS — F411 Generalized anxiety disorder: Secondary | ICD-10-CM

## 2019-08-03 DIAGNOSIS — F5105 Insomnia due to other mental disorder: Secondary | ICD-10-CM

## 2019-08-03 MED ORDER — SERTRALINE HCL 50 MG PO TABS: 50.0000 mg | ORAL_TABLET | Freq: Every day | ORAL | 1 refills | Status: DC

## 2019-08-03 NOTE — Progress Notes (Signed)
Virtual Visit via Video Note  I connected with Jenean LindauMegan Archey on 08/03/19 at 10:30 AM EDT by a video enabled telemedicine application and verified that I am speaking with the correct person using two identifiers.   I discussed the limitations of evaluation and management by telemedicine and the availability of in person appointments. The patient expressed understanding and agreed to proceed.   I discussed the assessment and treatment plan with the patient. The patient was provided an opportunity to ask questions and all were answered. The patient agreed with the plan and demonstrated an understanding of the instructions.   The patient was advised to call back or seek an in-person evaluation if the symptoms worsen or if the condition fails to improve as anticipated.   BH MD OP Progress Note  08/03/2019 1:38 PM Jenean LindauMegan Persichetti  MRN:  [id number]  Chief Complaint:  Chief Complaint    Follow-up     HPI: Aundra MilletMegan is a 33 year old Caucasian female, married, lives in [place], has a history of GAD, insomnia, GERD, asthma was evaluated by telemedicine today.  Patient today reports she is currently making progress on the current medication regimen.  She does not think her mood is worse on lower dosage of zoloft .  She hence wants to go lower on the Zoloft if possible to 50 mg.  She will monitor her symptoms closely while doing so.  She reports her husband's health is making progress.  She denies any side effects to the Zoloft and reports her hair loss has improved since being on a lower dosage.  Patient reports she was not able to get her TSH done however has upcoming appointment in October with her primary medical doctor and agrees to get it done.  She denies any suicidality, homicidality or perceptual disturbances.  She reports she continues to work with her therapist Ms. Lester CarolinaAmanda Miller. Visit Diagnosis:    ICD-10-CM   1. GAD (generalized anxiety disorder)  F41.1 sertraline (ZOLOFT) 50 MG  tablet  2. Insomnia due to mental condition  F51.05     Past Psychiatric History: I have reviewed past psychiatric history from my progress note on 02/01/2019.  Past Medical History:  Past Medical History:  Diagnosis Date  . Anxiety   . Asthma   . Depression   . GERD (gastroesophageal reflux disease)   . Headache     Past Surgical History:  Procedure Laterality Date  . CESAREAN SECTION N/A 04/14/2015   Procedure: CESAREAN SECTION;  Surgeon: Elenora Fenderhelsea C Ward, MD;  Location: [facility] ORS;  Service: Obstetrics;  Laterality: N/A;  . CESAREAN SECTION N/A 12/18/2016   Procedure: CESAREAN SECTION;  Surgeon: Conard NovakStephen D Jackson, MD;  Location: [facility] ORS;  Service: Obstetrics;  Laterality: N/A;  . KNEE SURGERY    . knees     bil arthroscopy    Family Psychiatric History: I have reviewed family psychiatric history from my progress note on 02/01/2019  Family History:  Family History  Problem Relation Age of Onset  . Colon polyps Father   . Hypertension Father   . Diabetes Father   . Heart disease Paternal Grandmother   . Rectal cancer Other   . Anxiety disorder Mother   . Depression Mother   . Anxiety disorder Sister   . Bipolar disorder Sister   . Depression Sister   . Anxiety disorder Sister   . Anxiety disorder Sister   . Anxiety disorder Sister     Social History: I have reviewed social history from my progress note  on 02/01/2019 Social History   Socioeconomic History  . Marital status: Married    Spouse name: zachary  . Number of children: 3  . Years of education: Not on file  . Highest education level: High school graduate  Occupational History  . Not on file  Social Needs  . Financial resource strain: Not hard at all  . Food insecurity    Worry: Never true    Inability: Never true  . Transportation needs    Medical: No    Non-medical: No  Tobacco Use  . Smoking status: Never Smoker  . Smokeless tobacco: Never Used  Substance and Sexual Activity  . Alcohol use: No   . Drug use: No  . Sexual activity: Yes    Comment: pregnant  Lifestyle  . Physical activity    Days per week: 3 days    Minutes per session: 60 min  . Stress: Rather much  Relationships  . Social Herbalist on phone: Not on file    Gets together: Not on file    Attends religious service: More than 4 times per year    Active member of club or organization: No    Attends meetings of clubs or organizations: Never    Relationship status: Married  Other Topics Concern  . Not on file  Social History Narrative  . Not on file    Allergies:  Allergies  Allergen Reactions  . Amoxicillin Shortness Of Breath and Other (See Comments)    Reaction:  Asthma attack  . Cephalosporins Other (See Comments) and Nausea And Vomiting    Other Reaction: Hives with Duricef  . Penicillins Shortness Of Breath, Other (See Comments) and Hives    Reaction:  Asthma attack Trouble breathing   . Prednisone     Metabolic Disorder Labs: No results found for: HGBA1C, MPG No results found for: PROLACTIN No results found for: CHOL, TRIG, HDL, CHOLHDL, VLDL, LDLCALC No results found for: TSH  Therapeutic Level Labs: No results found for: LITHIUM No results found for: VALPROATE No components found for:  CBMZ  Current Medications: Current Outpatient Medications  Medication Sig Dispense Refill  . omeprazole (PRILOSEC) 20 MG capsule Take 20 mg by mouth daily.    . sertraline (ZOLOFT) 50 MG tablet Take 1 tablet (50 mg total) by mouth daily. 90 tablet 1   No current facility-administered medications for this visit.      Musculoskeletal: Strength & Muscle Tone: UTA Gait & Station: normal Patient leans: N/A  Psychiatric Specialty Exam: Review of Systems  Psychiatric/Behavioral: Negative for depression, hallucinations, substance abuse and suicidal ideas. The patient is not nervous/anxious.   All other systems reviewed and are negative.   unknown if currently breastfeeding.There is no  height or weight on file to calculate BMI.  General Appearance: Casual  Eye Contact:  Fair  Speech:  Clear and Coherent  Volume:  Normal  Mood:  Euthymic  Affect:  Congruent  Thought Process:  Goal Directed and Descriptions of Associations: Intact  Orientation:  Full (Time, Place, and Person)  Thought Content: Logical   Suicidal Thoughts:  No  Homicidal Thoughts:  No  Memory:  Immediate;   Fair Recent;   Fair Remote;   Fair  Judgement:  Fair  Insight:  Fair  Psychomotor Activity:  Normal  Concentration:  Concentration: Fair and Attention Span: Fair  Recall:  [employer] of Knowledge: Fair  Language: Fair  Akathisia:  No  Handed:  Right  AIMS (  if indicated):Denies tremors, rigidity  Assets:  Communication Skills Desire for Improvement Housing Social Support  ADL's:  Intact  Cognition: WNL  Sleep:  Fair   Screenings:   Assessment and Plan: Haidy is a 33 year old female, married, stay-at-home mom, lives in [place], has a history of GAD, insomnia, asthma, GERD was evaluated by telemedicine today.  Patient is biologically predisposed given her family history.  She also has psychosocial stressors of her husband's health issues and a son who has autism.  Patient however is currently making progress.  We will continue plan as noted below.  Plan GAD- stable Reduce Zoloft to 50 mg p.o. daily Continue psychotherapy sessions with Ms. Lester [place].   Insomnia-improving Patient will continue to work on sleep hygiene techniques  Pending labs-TSH she will get it from her primary medical doctor  Follow-up in clinic in 2 to 3 months or sooner if needed.  December 30 at 2 PM  I have spent atleast 15 minutes non face to face with patient today. More than 50 % of the time was spent for psychoeducation and supportive psychotherapy and care coordination. This note was generated in part or whole with voice recognition software. Voice recognition is usually quite accurate but there are  transcription errors that can and very often do occur. I apologize for any typographical errors that were not detected and corrected.      Jomarie Longs, MD 08/03/2019, 1:38 PM

## 2019-11-17 ENCOUNTER — Ambulatory Visit (INDEPENDENT_AMBULATORY_CARE_PROVIDER_SITE_OTHER): Payer: 59 | Admitting: Psychiatry

## 2019-11-17 ENCOUNTER — Other Ambulatory Visit: Payer: Self-pay

## 2019-11-17 ENCOUNTER — Encounter: Payer: Self-pay | Admitting: Psychiatry

## 2019-11-17 DIAGNOSIS — F411 Generalized anxiety disorder: Secondary | ICD-10-CM

## 2019-11-17 DIAGNOSIS — F5105 Insomnia due to other mental disorder: Secondary | ICD-10-CM

## 2019-11-17 NOTE — Progress Notes (Signed)
Virtual Visit via Video Note  I connected with Angela Morton on 11/17/19 at  2:00 PM EST by a video enabled telemedicine application and verified that I am speaking with the correct person using two identifiers.   I discussed the limitations of evaluation and management by telemedicine and the availability of in person appointments. The patient expressed understanding and agreed to proceed.    I discussed the assessment and treatment plan with the patient. The patient was provided an opportunity to ask questions and all were answered. The patient agreed with the plan and demonstrated an understanding of the instructions.   The patient was advised to call back or seek an in-person evaluation if the symptoms worsen or if the condition fails to improve as anticipated.  BH MD OP Progress Note  11/17/2019 6:16 PM Na Waldrip  MRN:  [id number]  Chief Complaint:  Chief Complaint    Follow-up     HPI: Angela Morton is a 33 year old Caucasian female, married, lives in [place], has a history of GAD, insomnia, GERD, asthma was evaluated by telemedicine today.  Patient today reports she is currently doing well on the current medication regimen.  She denies any significant mood symptoms.  She denies any sadness or crying spells.  Patient reports sleep is good.  She denies any suicidality or homicidality.  She reports she had a good break during the holiday season even though she does not celebrate Christmas.  She did spent time with her family.  She reports she has not been able to get her TSH done yet however is planning to get it done at her new primary care office soon.  Patient denies any other concerns today. Visit Diagnosis:    ICD-10-CM   1. GAD (generalized anxiety disorder)  F41.1   2. Insomnia due to mental condition  F51.05     Past Psychiatric History: I have reviewed past psychiatric history from my progress note on 02/01/2019.  Past Medical History:  Past Medical History:   Diagnosis Date  . Anxiety   . Asthma   . Depression   . GERD (gastroesophageal reflux disease)   . Headache     Past Surgical History:  Procedure Laterality Date  . CESAREAN SECTION N/A 04/14/2015   Procedure: CESAREAN SECTION;  Surgeon: Elenora Fender Ward, MD;  Location: [facility] OR[facility];  Service: Obstetrics;  Laterality: N/A;  . CESAREAN SECTION N/A 12/18/2016   Procedure: CESAREAN SECTION;  Surgeon: Conard Novak, MD;  Location: [facility] OR[facility];  Service: Obstetrics;  Laterality: N/A;  . KNEE SURGERY    . knees     bil arthroscopy    Family Psychiatric History: I have reviewed family psychiatric history from my progress note on 02/01/2019.  Family History:  Family History  Problem Relation Age of Onset  . Colon polyps Father   . Hypertension Father   . Diabetes Father   . Heart disease Paternal Grandmother   . Rectal cancer Other   . Anxiety disorder Mother   . Depression Mother   . Anxiety disorder Sister   . Bipolar disorder Sister   . Depression Sister   . Anxiety disorder Sister   . Anxiety disorder Sister   . Anxiety disorder Sister     Social history: Reviewed social history from my progress note on 02/01/2019. Social History   Socioeconomic History  . Marital status: Married    Spouse name: zachary  . Number of children: 3  . Years of education: Not on file  . Highest education  level: High school graduate  Occupational History  . Not on file  Tobacco Use  . Smoking status: Never Smoker  . Smokeless tobacco: Never Used  Substance and Sexual Activity  . Alcohol use: No  . Drug use: No  . Sexual activity: Yes    Comment: pregnant  Other Topics Concern  . Not on file  Social History Narrative  . Not on file   Social Determinants of Health   Financial Resource Strain: Low Risk   . Difficulty of Paying Living Expenses: Not hard at all  Food Insecurity: No Food Insecurity  . Worried About Programme researcher, broadcasting/film/videounning Out of Food in the Last Year: Never true  . Ran Out of Food in the  Last Year: Never true  Transportation Needs: No Transportation Needs  . Lack of Transportation (Medical): No  . Lack of Transportation (Non-Medical): No  Physical Activity: Sufficiently Active  . Days of Exercise per Week: 3 days  . Minutes of Exercise per Session: 60 min  Stress: Stress Concern Present  . Feeling of Stress : Rather much  Social Connections: Unknown  . Frequency of Communication with Friends and Family: Not on file  . Frequency of Social Gatherings with Friends and Family: Not on file  . Attends Religious Services: More than 4 times per year  . Active Member of Clubs or Organizations: No  . Attends BankerClub or Organization Meetings: Never  . Marital Status: Married    Allergies:  Allergies  Allergen Reactions  . Amoxicillin Shortness Of Breath and Other (See Comments)    Reaction:  Asthma attack  . Cephalosporins Other (See Comments) and Nausea And Vomiting    Other Reaction: Hives with Duricef  . Penicillins Shortness Of Breath, Other (See Comments) and Hives    Reaction:  Asthma attack Trouble breathing   . Prednisone     Metabolic Disorder Labs: No results found for: HGBA1C, MPG No results found for: PROLACTIN No results found for: CHOL, TRIG, HDL, CHOLHDL, VLDL, LDLCALC No results found for: TSH  Therapeutic Level Labs: No results found for: LITHIUM No results found for: VALPROATE No components found for:  CBMZ  Current Medications: Current Outpatient Medications  Medication Sig Dispense Refill  . omeprazole (PRILOSEC) 20 MG capsule Take 20 mg by mouth daily.    . sertraline (ZOLOFT) 50 MG tablet Take 1 tablet (50 mg total) by mouth daily. 90 tablet 1   No current facility-administered medications for this visit.     Musculoskeletal: Strength & Muscle Tone: UTA Gait & Station: normal Patient leans: N/A  Psychiatric Specialty Exam: Review of Systems  Psychiatric/Behavioral: Negative for agitation, behavioral problems, confusion, decreased  concentration, dysphoric mood, hallucinations, self-injury, sleep disturbance and suicidal ideas. The patient is not nervous/anxious and is not hyperactive.   All other systems reviewed and are negative.   unknown if currently breastfeeding.There is no height or weight on file to calculate BMI.  General Appearance: Casual  Eye Contact:  Fair  Speech:  Clear and Coherent  Volume:  Normal  Mood:  Euthymic  Affect:  Congruent  Thought Process:  Goal Directed and Descriptions of Associations: Intact  Orientation:  Full (Time, Place, and Person)  Thought Content: Logical   Suicidal Thoughts:  No  Homicidal Thoughts:  No  Memory:  Immediate;   Fair Recent;   Fair Remote;   Fair  Judgement:  Fair  Insight:  Fair  Psychomotor Activity:  Normal  Concentration:  Concentration: Fair and Attention Span: Fair  Recall:  [employer] of Knowledge: Fair  Language: Fair  Akathisia:  No  Handed:  Right  AIMS (if indicated): Denies tremors, rigidity  Assets:  Communication Skills Desire for Improvement Social Support  ADL's:  Intact  Cognition: WNL  Sleep:  Fair   Screenings:   Assessment and Plan: Angela Morton is a 33 year old female, married, stay-at-home mom, lives in [place], has a history of GAD, insomnia, asthma, GERD was evaluated by telemedicine today.  Patient is biologically predisposed given her family history.  She also has psychosocial stressors of her husband's health issues at this time who has autism.  Patient is currently doing well on the current medication regimen.  Plan as noted below.  Plan GAD-stable Zoloft at reduced dose to 50 mg p.o. daily Continue psychotherapy sessions with Ms. Rhona Raider.  Insomnia-stable She will continue to work on sleep hygiene techniques  Pending labs-TSH-encouraged patient to get it done.  Follow-up in clinic in 3 months or sooner if needed.  April 8 at 2:20  PM  I have spent atleast 15 minutes non face to face with patient today. More  than 50 % of the time was spent for psychoeducation and supportive psychotherapy and care coordination. This note was generated in part or whole with voice recognition software. Voice recognition is usually quite accurate but there are transcription errors that can and very often do occur. I apologize for any typographical errors that were not detected and corrected.       Ursula Alert, MD 11/17/2019, 6:16 PM

## 2020-02-13 ENCOUNTER — Other Ambulatory Visit: Payer: Self-pay | Admitting: Psychiatry

## 2020-02-13 DIAGNOSIS — F411 Generalized anxiety disorder: Secondary | ICD-10-CM

## 2020-02-24 ENCOUNTER — Ambulatory Visit (INDEPENDENT_AMBULATORY_CARE_PROVIDER_SITE_OTHER): Payer: 59 | Admitting: Psychiatry

## 2020-02-24 ENCOUNTER — Encounter: Payer: Self-pay | Admitting: Psychiatry

## 2020-02-24 ENCOUNTER — Other Ambulatory Visit: Payer: Self-pay

## 2020-02-24 DIAGNOSIS — F411 Generalized anxiety disorder: Secondary | ICD-10-CM | POA: Diagnosis not present

## 2020-02-24 DIAGNOSIS — F5105 Insomnia due to other mental disorder: Secondary | ICD-10-CM | POA: Diagnosis not present

## 2020-02-24 NOTE — Patient Instructions (Signed)
Follow-up in clinic July 26 at 2 PM

## 2020-02-24 NOTE — Progress Notes (Signed)
Provider Location : [facility] Patient Location : Home  Virtual Visit via Video Note  I connected with Angela Morton on 02/24/20 at  2:20 PM EDT by a video enabled telemedicine application and verified that I am speaking with the correct person using two identifiers.   I discussed the limitations of evaluation and management by telemedicine and the availability of in person appointments. The patient expressed understanding and agreed to proceed.    I discussed the assessment and treatment plan with the patient. The patient was provided an opportunity to ask questions and all were answered. The patient agreed with the plan and demonstrated an understanding of the instructions.   The patient was advised to call back or seek an in-person evaluation if the symptoms worsen or if the condition fails to improve as anticipated.    BH MD OP Progress Note  02/24/2020 2:00 PM Angela Morton  MRN:  [id number]  Chief Complaint:  Chief Complaint    Follow-up     HPI: Angela Morton is a 34 year old Caucasian female, married, lives in [place], has a history of GAD, insomnia, GERD, asthma was evaluated by telemedicine today.  Patient today reports she is currently doing much better with her anxiety symptoms.  She is able to cope with her anxiety and nervousness.  She denies any anxiety attacks.  She reports sleep continues to be good.  She reports she got her COVID-19 vaccine and is due for her next shot next week.  She and her family are going on vacation to the beach with her parents for their anniversary.  She looks forward to that.  She reports she has been in psychotherapy sessions with Ms. Lester Pima and her recent session went really well.  Per her therapist she has graduated and may not need regular therapy sessions at this time.  Patient denies any suicidality, homicidality or perceptual disturbances.  Patient denies any other concerns today. Visit Diagnosis:    ICD-10-CM   1. GAD (generalized  anxiety disorder)  F41.1   2. Insomnia due to mental condition  F51.05     Past Psychiatric History: I have reviewed past psychiatric history from my progress note on 02/01/2019  Past Medical History:  Past Medical History:  Diagnosis Date  . Anxiety   . Asthma   . Depression   . GERD (gastroesophageal reflux disease)   . Headache     Past Surgical History:  Procedure Laterality Date  . CESAREAN SECTION N/A 04/14/2015   Procedure: CESAREAN SECTION;  Surgeon: Elenora Fender Ward, MD;  Location: [facility] ORS;  Service: Obstetrics;  Laterality: N/A;  . CESAREAN SECTION N/A 12/18/2016   Procedure: CESAREAN SECTION;  Surgeon: Conard Novak, MD;  Location: [facility] ORS;  Service: Obstetrics;  Laterality: N/A;  . KNEE SURGERY    . knees     bil arthroscopy    Family Psychiatric History: I have reviewed family psychiatric history from my progress note on 02/01/2019  Family History:  Family History  Problem Relation Age of Onset  . Colon polyps Father   . Hypertension Father   . Diabetes Father   . Heart disease Paternal Grandmother   . Rectal cancer Other   . Anxiety disorder Mother   . Depression Mother   . Anxiety disorder Sister   . Bipolar disorder Sister   . Depression Sister   . Anxiety disorder Sister   . Anxiety disorder Sister   . Anxiety disorder Sister     Social History: I have reviewed social  history from my progress note on 02/01/2019 Social History   Socioeconomic History  . Marital status: Married    Spouse name: zachary  . Number of children: 3  . Years of education: Not on file  . Highest education level: High school graduate  Occupational History  . Not on file  Tobacco Use  . Smoking status: Never Smoker  . Smokeless tobacco: Never Used  Substance and Sexual Activity  . Alcohol use: No  . Drug use: No  . Sexual activity: Yes    Comment: pregnant  Other Topics Concern  . Not on file  Social History Narrative  . Not on file   Social Determinants of  Health   Financial Resource Strain:   . Difficulty of Paying Living Expenses:   Food Insecurity:   . Worried About Programme researcher, broadcasting/film/video in the Last Year:   . Barista in the Last Year:   Transportation Needs:   . Freight forwarder (Medical):   Marland Kitchen Lack of Transportation (Non-Medical):   Physical Activity:   . Days of Exercise per Week:   . Minutes of Exercise per Session:   Stress:   . Feeling of Stress :   Social Connections:   . Frequency of Communication with Friends and Family:   . Frequency of Social Gatherings with Friends and Family:   . Attends Religious Services:   . Active Member of Clubs or Organizations:   . Attends Banker Meetings:   Marland Kitchen Marital Status:     Allergies:  Allergies  Allergen Reactions  . Amoxicillin Shortness Of Breath and Other (See Comments)    Reaction:  Asthma attack  . Cephalosporins Other (See Comments) and Nausea And Vomiting    Other Reaction: Hives with Duricef  . Penicillins Shortness Of Breath, Other (See Comments) and Hives    Reaction:  Asthma attack Trouble breathing   . Prednisone     Metabolic Disorder Labs: No results found for: HGBA1C, MPG No results found for: PROLACTIN No results found for: CHOL, TRIG, HDL, CHOLHDL, VLDL, LDLCALC No results found for: TSH  Therapeutic Level Labs: No results found for: LITHIUM No results found for: VALPROATE No components found for:  CBMZ  Current Medications: Current Outpatient Medications  Medication Sig Dispense Refill  . albuterol (VENTOLIN HFA) 108 (90 Base) MCG/ACT inhaler Inhale into the lungs.    Marland Kitchen omeprazole (PRILOSEC) 20 MG capsule Take 20 mg by mouth daily.    . sertraline (ZOLOFT) 50 MG tablet TAKE 1 TABLET(50 MG) BY MOUTH DAILY 90 tablet 1   No current facility-administered medications for this visit.     Musculoskeletal: Strength & Muscle Tone: UTA Gait & Station: normal Patient leans: N/A  Psychiatric Specialty Exam: Review of Systems   Psychiatric/Behavioral: Negative for agitation, behavioral problems, confusion, decreased concentration, dysphoric mood, hallucinations, self-injury, sleep disturbance and suicidal ideas. The patient is not nervous/anxious and is not hyperactive.   All other systems reviewed and are negative.   unknown if currently breastfeeding.There is no height or weight on file to calculate BMI.  General Appearance: Casual  Eye Contact:  Fair  Speech:  Clear and Coherent  Volume:  Normal  Mood:  Euthymic  Affect:  Appropriate  Thought Process:  Goal Directed and Descriptions of Associations: Intact  Orientation:  Full (Time, Place, and Person)  Thought Content: Logical   Suicidal Thoughts:  No  Homicidal Thoughts:  No  Memory:  Immediate;   Fair Recent;  Fair Remote;   Fair  Judgement:  Fair  Insight:  Fair  Psychomotor Activity:  Normal  Concentration:  Concentration: Fair and Attention Span: Fair  Recall:  Good  Fund of Knowledge: Fair  Language: Fair  Akathisia:  No  Handed:  Right  AIMS (if indicated):UTA  Assets:  Communication Skills Desire for Improvement Housing Social Support  ADL's:  Intact  Cognition: WNL  Sleep:  Fair   Screenings: GAD-7     Office Visit from 02/24/2020 in Portal  Total GAD-7 Score  1       Assessment and Plan: Thurza is a 34 year old female, married, stay-at-home mom, lives in [place], has a history of GAD, insomnia, asthma, GERD was evaluated by telemedicine today.  She is biologically predisposed given her family history.  She also has psychosocial stressors of her husband's health issues and her son who has autism.  Patient however is currently making progress and is stable on current medication regimen.  Plan GAD-stable Zoloft 50 mg p.o. daily. Discussed with patient that if she continues to remain stable she can be weaned off of it. Continue psychotherapy sessions with Ms. Rhona Raider as needed GAD 7  today-1  Insomnia-stable Continue to work on sleep hygiene techniques  Pending labs-TSH-she will get it done with her primary care provider.  Follow-up in clinic in 3 months or sooner if needed.  I have spent atleast 19 minutes non face to face with patient today. More than 50 % of the time was spent for preparing to see the patient ( e.g., review of test, records ), ordering medications and test ,psychoeducation and supportive psychotherapy and care coordination,as well as documenting clinical information in electronic health record. This note was generated in part or whole with voice recognition software. Voice recognition is usually quite accurate but there are transcription errors that can and very often do occur. I apologize for any typographical errors that were not detected and corrected.       Ursula Alert, MD 02/24/2020, 2:00 PM

## 2020-06-12 ENCOUNTER — Encounter: Payer: Self-pay | Admitting: Psychiatry

## 2020-06-12 ENCOUNTER — Telehealth (INDEPENDENT_AMBULATORY_CARE_PROVIDER_SITE_OTHER): Payer: 59 | Admitting: Psychiatry

## 2020-06-12 ENCOUNTER — Other Ambulatory Visit: Payer: Self-pay

## 2020-06-12 DIAGNOSIS — F411 Generalized anxiety disorder: Secondary | ICD-10-CM | POA: Diagnosis not present

## 2020-06-12 DIAGNOSIS — F5105 Insomnia due to other mental disorder: Secondary | ICD-10-CM | POA: Diagnosis not present

## 2020-06-12 NOTE — Progress Notes (Signed)
Provider Location : [facility] Patient Location : Home  Virtual Visit via Video Note  I connected with Angela Morton on 06/12/20 at  2:00 PM EDT by a video enabled telemedicine application and verified that I am speaking with the correct person using two identifiers.   I discussed the limitations of evaluation and management by telemedicine and the availability of in person appointments. The patient expressed understanding and agreed to proceed.    I discussed the assessment and treatment plan with the patient. The patient was provided an opportunity to ask questions and all were answered. The patient agreed with the plan and demonstrated an understanding of the instructions.   The patient was advised to call back or seek an in-person evaluation if the symptoms worsen or if the condition fails to improve as anticipated.  BH MD OP Progress Note  06/12/2020 2:54 PM Angela Morton  MRN:  [id number]  Chief Complaint:  Chief Complaint    Follow-up     HPI: Angela Morton is a 34 year old Caucasian female, married, lives in [place], has a history of GAD, insomnia, GERD, asthma was evaluated by telemedicine today.  Patient today reports she is currently doing well on the current medication regimen.  She denies any significant anxiety or depressive symptoms.  She continues to be compliant on Zoloft.  Denies side effects.  She reports sleep and appetite as fair.  Patient denies any suicidality, homicidality or perceptual disturbances.  Patient denies any other concerns today.  Visit Diagnosis:    ICD-10-CM   1. GAD (generalized anxiety disorder)  F41.1   2. Insomnia due to mental condition  F51.05     Past Psychiatric History: I have reviewed past psychiatric history from my progress note on 2019-02-01  Past Medical History:  Past Medical History:  Diagnosis Date   Anxiety    Asthma    Depression    GERD (gastroesophageal reflux disease)    Headache     Past Surgical  History:  Procedure Laterality Date   CESAREAN SECTION N/A 04/14/2015   Procedure: CESAREAN SECTION;  Surgeon: Elenora Fender Ward, MD;  Location: [facility];  Service: Obstetrics;  Laterality: N/A;   CESAREAN SECTION N/A 12/18/2016   Procedure: CESAREAN SECTION;  Surgeon: Conard Novak, MD;  Location: [facility] ORS;  Service: Obstetrics;  Laterality: N/A;   KNEE SURGERY     knees     bil arthroscopy    Family Psychiatric History: I have reviewed family psychiatric history from my progress note on 2019-02-01  Family History:  Family History  Problem Relation Age of Onset   Colon polyps Father    Hypertension Father    Diabetes Father    Heart disease Paternal Grandmother    Rectal cancer Other    Anxiety disorder Mother    Depression Mother    Anxiety disorder Sister    Bipolar disorder Sister    Depression Sister    Anxiety disorder Sister    Anxiety disorder Sister    Anxiety disorder Sister     Social History: I have reviewed social history from my progress note on 02/01/2019 Social History   Socioeconomic History   Marital status: Married    Spouse name: zachary   Number of children: 3   Years of education: Not on file   Highest education level: High school graduate  Occupational History   Not on file  Tobacco Use   Smoking status: Never Smoker   Smokeless tobacco: Never Used  Advertising account planner  Vaping Use: Never used  Substance and Sexual Activity   Alcohol use: No   Drug use: No   Sexual activity: Yes    Comment: pregnant  Other Topics Concern   Not on file  Social History Narrative   Not on file   Social Determinants of Health   Financial Resource Strain:    Difficulty of Paying Living Expenses:   Food Insecurity:    Worried About Programme researcher, broadcasting/film/video in the Last Year:    Barista in the Last Year:   Transportation Needs:    Freight forwarder (Medical):    Lack of Transportation (Non-Medical):   Physical Activity:     Days of Exercise per Week:    Minutes of Exercise per Session:   Stress:    Feeling of Stress :   Social Connections:    Frequency of Communication with Friends and Family:    Frequency of Social Gatherings with Friends and Family:    Attends Religious Services:    Active Member of Clubs or Organizations:    Attends Banker Meetings:    Marital Status:     Allergies:  Allergies  Allergen Reactions   Amoxicillin Shortness Of Breath and Other (See Comments)    Reaction:  Asthma attack   Cephalosporins Other (See Comments) and Nausea And Vomiting    Other Reaction: Hives with Duricef Other Reaction: Hives with Duricef Other Reaction: Hives with Duricef   Penicillins Shortness Of Breath, Other (See Comments) and Hives    Reaction:  Asthma attack Trouble breathing  Reaction: Asthma attack Trouble breathing   Prednisone     Metabolic Disorder Labs: No results found for: HGBA1C, MPG No results found for: PROLACTIN No results found for: CHOL, TRIG, HDL, CHOLHDL, VLDL, LDLCALC No results found for: TSH  Therapeutic Level Labs: No results found for: LITHIUM No results found for: VALPROATE No components found for:  CBMZ  Current Medications: Current Outpatient Medications  Medication Sig Dispense Refill   albuterol (VENTOLIN HFA) 108 (90 Base) MCG/ACT inhaler Inhale into the lungs.     omeprazole (PRILOSEC) 20 MG capsule Take 20 mg by mouth daily.     sertraline (ZOLOFT) 50 MG tablet TAKE 1 TABLET(50 MG) BY MOUTH DAILY 90 tablet 1   No current facility-administered medications for this visit.     Musculoskeletal: Strength & Muscle Tone: UTA Gait & Station: normal Patient leans: N/A  Psychiatric Specialty Exam: Review of Systems  Psychiatric/Behavioral: Negative for agitation, behavioral problems, confusion, decreased concentration, dysphoric mood, hallucinations, self-injury, sleep disturbance and suicidal ideas. The patient is not  nervous/anxious and is not hyperactive.   All other systems reviewed and are negative.   unknown if currently breastfeeding.There is no height or weight on file to calculate BMI.  General Appearance: Casual  Eye Contact:  Fair  Speech:  Clear and Coherent  Volume:  Normal  Mood:  Euthymic  Affect:  Congruent  Thought Process:  Goal Directed and Descriptions of Associations: Intact  Orientation:  Full (Time, Place, and Person)  Thought Content: Logical   Suicidal Thoughts:  No  Homicidal Thoughts:  No  Memory:  Immediate;   Fair Recent;   Fair Remote;   Fair  Judgement:  Fair  Insight:  Fair  Psychomotor Activity:  Normal  Concentration:  Concentration: Fair and Attention Span: Fair  Recall:  [employer] of Knowledge: Fair  Language: Fair  Akathisia:  No  Handed:  Right  AIMS (if  indicated): UTA  Assets:  Communication Skills Desire for Improvement Housing Social Support  ADL's:  Intact  Cognition: WNL  Sleep:  Fair   Screenings: GAD-7     [facility] Visit [facility]/24/2020 in [facility]  Total GAD-7 Score 1       Assessment and Plan: Angela Morton is a 34 year old female, married, lives in [place], has a history of GAD, insomnia, asthma, GERD was evaluated by telemedicine today.  Patient is biologically predisposed given her family history.  Patient with psychosocial stressors of her husband's health issues, her son who has autism.  Patient however is currently stable.  Plan as noted below.  Plan GAD-stable Zoloft 50 mg p.o. daily Continue psychotherapy sessions with Ms. Lester Maryhill as needed Patient could be weaned off of Zoloft within the next 6 months if she continues to remain stable.  However will transition patient back to her primary care provider since patient is currently stable and also based on patient preference.  Insomnia-stable Continue sleep hygiene techniques  Follow-up in clinic as needed.  Will route today's notes  to Dr. Einar Crow per patient request.  I have spent atleast 19 minutes non- face to face with patient today. More than 50 % of the time was spent for ordering medications and test ,psychoeducation and supportive psychotherapy and care coordination,as well as documenting clinical information in electronic health record. This note was generated in part or whole with voice recognition software. Voice recognition is usually quite accurate but there are transcription errors that can and very often do occur. I apologize for any typographical errors that were not detected and corrected.           Jomarie Longs, MD 06/12/2020, 2:54 PM

## 2023-05-02 ENCOUNTER — Other Ambulatory Visit: Payer: Self-pay

## 2023-05-02 ENCOUNTER — Emergency Department
Admission: EM | Admit: 2023-05-02 | Discharge: 2023-05-02 | Disposition: A | Discharge status: Home / Self Care | Payer: Managed Care, Other (non HMO) | Attending: Emergency Medicine | Admitting: Emergency Medicine

## 2023-05-02 ENCOUNTER — Encounter: Payer: Self-pay | Admitting: Emergency Medicine

## 2023-05-02 ENCOUNTER — Emergency Department: Payer: Managed Care, Other (non HMO)

## 2023-05-02 DIAGNOSIS — J45909 Unspecified asthma, uncomplicated: Secondary | ICD-10-CM | POA: Insufficient documentation

## 2023-05-02 DIAGNOSIS — Z87442 Personal history of urinary calculi: Secondary | ICD-10-CM | POA: Insufficient documentation

## 2023-05-02 DIAGNOSIS — N390 Urinary tract infection, site not specified: Secondary | ICD-10-CM | POA: Insufficient documentation

## 2023-05-02 DIAGNOSIS — R109 Unspecified abdominal pain: Secondary | ICD-10-CM | POA: Diagnosis present

## 2023-05-02 LAB — CBC
HCT: 40.1 % (ref 36.0–46.0)
Hemoglobin: 12.7 g/dL (ref 12.0–15.0)
MCH: 26.3 pg (ref 26.0–34.0)
MCHC: 31.7 g/dL (ref 30.0–36.0)
MCV: 83 fL (ref 80.0–100.0)
Platelets: 398 10*3/uL (ref 150–400)
RBC: 4.83 MIL/uL (ref 3.87–5.11)
RDW: 13.6 % (ref 11.5–15.5)
WBC: 8.5 10*3/uL (ref 4.0–10.5)
nRBC: 0 % (ref 0.0–0.2)

## 2023-05-02 LAB — LIPASE, BLOOD: Lipase: 32 U/L (ref 11–51)

## 2023-05-02 LAB — URINALYSIS, ROUTINE W REFLEX MICROSCOPIC
Bilirubin Urine: NEGATIVE
Glucose, UA: NEGATIVE mg/dL
Ketones, ur: NEGATIVE mg/dL
Leukocytes,Ua: NEGATIVE
Nitrite: NEGATIVE
Protein, ur: NEGATIVE mg/dL
Specific Gravity, Urine: 1.001 — ABNORMAL LOW (ref 1.005–1.030)
pH: 7 (ref 5.0–8.0)

## 2023-05-02 LAB — COMPREHENSIVE METABOLIC PANEL
ALT: 20 U/L (ref 0–44)
AST: 20 U/L (ref 15–41)
Albumin: 4.2 g/dL (ref 3.5–5.0)
Alkaline Phosphatase: 64 U/L (ref 38–126)
Anion gap: 10 (ref 5–15)
BUN: 14 mg/dL (ref 6–20)
CO2: 24 mmol/L (ref 22–32)
Calcium: 9.4 mg/dL (ref 8.9–10.3)
Chloride: 103 mmol/L (ref 98–111)
Creatinine, Ser: 0.75 mg/dL (ref 0.44–1.00)
GFR, Estimated: >60 mL/min (ref >60)
Glucose, Bld: 87 mg/dL (ref 70–99)
Potassium: 4.1 mmol/L (ref 3.5–5.1)
Sodium: 137 mmol/L (ref 135–145)
Total Bilirubin: 0.3 mg/dL (ref 0.3–1.2)
Total Protein: 8.6 g/dL — ABNORMAL HIGH (ref 6.5–8.1)

## 2023-05-02 LAB — POC URINE PREG, ED: Preg Test, Ur: NEGATIVE

## 2023-05-02 MED ORDER — CIPROFLOXACIN HCL 500 MG PO TABS: 500.0000 mg | ORAL_TABLET | Freq: Two times a day (BID) | ORAL | 0 refills | Status: AC

## 2023-05-02 MED ORDER — DIPHENHYDRAMINE HCL 50 MG/ML IJ SOLN: 12.5000 mg | Freq: Once | INTRAMUSCULAR | Status: DC | PRN

## 2023-05-02 MED ORDER — ONDANSETRON 4 MG PO TBDP: 4.0000 mg | ORAL_TABLET | Freq: Three times a day (TID) | ORAL | 0 refills | Status: AC | PRN

## 2023-05-02 MED ORDER — DIPHENHYDRAMINE HCL 50 MG/ML IJ SOLN: 12.5000 mg | Freq: Once | INTRAMUSCULAR | Status: DC

## 2023-05-02 MED ORDER — MORPHINE SULFATE (PF) 4 MG/ML IV SOLN
4.0000 mg | Freq: Once | INTRAVENOUS | Status: AC
  Administered 2023-05-02: 4 mg via INTRAVENOUS
  Filled 2023-05-02: qty 1

## 2023-05-02 MED ORDER — SODIUM CHLORIDE 0.9 % IV BOLUS
1000.0000 mL | Freq: Once | INTRAVENOUS | Status: AC
  Administered 2023-05-02: 1000 mL via INTRAVENOUS

## 2023-05-02 MED ORDER — ONDANSETRON HCL 4 MG/2ML IJ SOLN
4.0000 mg | Freq: Once | INTRAMUSCULAR | Status: AC
  Administered 2023-05-02: 4 mg via INTRAVENOUS
  Filled 2023-05-02: qty 2

## 2023-05-02 MED ORDER — FENTANYL CITRATE PF 50 MCG/ML IJ SOSY: 50.0000 ug | PREFILLED_SYRINGE | Freq: Once | INTRAMUSCULAR | Status: DC

## 2023-05-02 NOTE — Discharge Instructions (Signed)
Stop taking the Macrobid this may be causing a lot of the nausea symptoms that you are having You Cipro for 3 days only. Zofran ODT for vomiting and nausea as needed Return the emergency department if you are worsening You may take Tylenol and ibuprofen for pain if needed

## 2023-05-02 NOTE — ED Notes (Signed)
Pt states morphine makes her nose severely itchy; notified provider Nash Dimmer. Awaiting reply/orders.

## 2023-05-02 NOTE — ED Notes (Signed)
Pt denies itchiness currently. Pt leaving for imaging.

## 2023-05-02 NOTE — ED Notes (Signed)
Pt verbal she wants to go ahead and take morphine.

## 2023-05-02 NOTE — ED Triage Notes (Addendum)
Pt via POV from home. Pt was dx with UTI on Monday. Pt c/o R sided abd pain, lower back pain, and nausea on Tuesday. Pt was started on Macrobid. States that she had a low grade fever. Pt still having urinary frequency. Pt is A&Ox4 and NAD

## 2023-05-02 NOTE — ED Notes (Addendum)
Lactic Acid sent to lab at this time.

## 2023-05-02 NOTE — ED Provider Notes (Signed)
[facility] Provider Note    Event Date/Time   First MD Initiated Contact with Patient 05/02/23 1102     (approximate)   History   Abdominal Pain   HPI  Angela Morton is a 37 y.o. female with history of asthma, GERD, depression and headaches presents emergency department complaining of continued abdominal/flank pain along with nausea.  Patient was diagnosed with UTI on Monday.  Worsening symptoms started on Tuesday.  No dysuria at this time.  Does have a low-grade fever.  Has not had vomiting yet.  Still has her gallbladder and appendix. History of kidney stones      Physical Exam   Triage Vital Signs: ED Triage Vitals  Enc Vitals Group     BP 05/02/23 1019 (!) 151/101     Pulse Rate 05/02/23 1019 92     Resp 05/02/23 1019 18     Temp 05/02/23 1019 98 F (36.7 C)     Temp Source 05/02/23 1019 Oral     SpO2 05/02/23 1019 100 %     Weight 05/02/23 1017 247 lb (112 kg)     Height 05/02/23 1017 5\' 6"  (1.676 m)     Head Circumference --      Peak Flow --      Pain Score 05/02/23 1017 8     Pain Loc --      Pain Edu? --      Excl. in GC? --     Most recent vital signs: Vitals:   05/02/23 1019 05/02/23 1117  BP: (!) 151/101 (!) 133/92  Pulse: 92 91  Resp: 18 17  Temp: 98 F (36.7 C)   SpO2: 100% 97%     General: Awake, no distress.   CV:  Good peripheral perfusion. regular rate and  rhythm Resp:  Normal effort. Lungs cta Abd:  No distention.  Nontender, CVA tenderness noted on the right Other:      ED Results / Procedures / Treatments   Labs (all labs ordered are listed, but only abnormal results are displayed) Labs Reviewed  COMPREHENSIVE METABOLIC PANEL - Abnormal; Notable for the following components:      Result Value   Total Protein 8.6 (*)    All other components within normal limits  URINALYSIS, ROUTINE W REFLEX MICROSCOPIC - Abnormal; Notable for the following components:   Color, Urine STRAW (*)    APPearance CLEAR  (*)    Specific Gravity, Urine 1.001 (*)    Hgb urine dipstick SMALL (*)    Bacteria, UA RARE (*)    All other components within normal limits  URINE CULTURE  LIPASE, BLOOD  CBC  POC URINE PREG, ED     EKG     RADIOLOGY CT renal stone    PROCEDURES:   Procedures   MEDICATIONS ORDERED IN ED: Medications  diphenhydrAMINE (BENADRYL) injection 12.5 mg (has no administration in time range)  sodium chloride 0.9 % bolus 1,000 mL (0 mLs Intravenous Stopped 05/02/23 1220)  ondansetron (ZOFRAN) injection 4 mg (4 mg Intravenous Given 05/02/23 1126)  morphine (PF) 4 MG/ML injection 4 mg (4 mg Intravenous Given 05/02/23 1129)     IMPRESSION / MDM / ASSESSMENT AND PLAN / ED COURSE  I reviewed the triage vital signs and the nursing notes.                              Differential diagnosis includes, but is  not limited to, pyelonephritis, UTI, medication reaction, kidney stone, infected kidney stone, acute cholecystitis  Patient's presentation is most consistent with acute presentation with potential threat to life or bodily function.   Patient's labs are reassuring  Patient's initial UA at [facility] clinic on Monday did show a lot of blood in her urine along with white cells.  No bacteria.  Urine culture from [facility] clinic shows staph.  Due to the possibility of infected kidney stone, kidney stone, or pyelonephritis will do CT renal stone study, patient was given normal saline 1 L IV, morphine 4 mg IV and Zofran 4 mg IV   CT renal stone study, I did independently review and interpret the radiologist reading as being negative for any acute abnormality  I did explain the findings to the patient.  Feel that to finish out for her urinary tract infection we will place her on Cipro twice daily for 3 days.  She is to stop taking the Macrobid.  She is in agreement treatment plan.  She was also given a prescription for Zofran ODT's.  Discharged stable condition.   FINAL CLINICAL  IMPRESSION(S) / ED DIAGNOSES   Final diagnoses:  Acute UTI     Rx / DC Orders   ED Discharge Orders          Ordered    ciprofloxacin (CIPRO) 500 MG tablet  2 times daily        05/02/23 1220    ondansetron (ZOFRAN-ODT) 4 MG disintegrating tablet  Every 8 hours PRN        05/02/23 1220             Note:  This document was prepared using Dragon voice recognition software and may include unintentional dictation errors.    Faythe Ghee, PA-C 05/02/23 1221    Pilar Jarvis, MD 05/02/23 [phone]

## 2023-05-03 LAB — URINE CULTURE: Culture: <10000 — AB

## 2023-12-23 ENCOUNTER — Other Ambulatory Visit: Payer: Self-pay | Admitting: Ophthalmology

## 2023-12-23 DIAGNOSIS — H4710 Unspecified papilledema: Secondary | ICD-10-CM

## 2023-12-27 ENCOUNTER — Ambulatory Visit: Payer: Managed Care, Other (non HMO)

## 2024-01-01 ENCOUNTER — Ambulatory Visit: Payer: Managed Care, Other (non HMO)

## 2024-12-20 ENCOUNTER — Encounter: Payer: Self-pay | Admitting: *Deleted
